# Patient Record
Sex: Male | Born: 1975
Health system: Southern US, Community
[De-identification: ages and names within clinical notes are randomized; demographics above are authoritative.]

## PROBLEM LIST (undated history)

## (undated) DIAGNOSIS — J45909 Unspecified asthma, uncomplicated: Secondary | ICD-10-CM

---

## 2015-07-28 ENCOUNTER — Emergency Department (INDEPENDENT_AMBULATORY_CARE_PROVIDER_SITE_OTHER)
Admission: EM | Admit: 2015-07-28 | Discharge: 2015-07-28 | Disposition: A | Discharge status: Home / Self Care | Payer: Medicaid Other | Source: Home / Self Care | Attending: Family Medicine | Admitting: Family Medicine

## 2015-07-28 ENCOUNTER — Encounter (HOSPITAL_COMMUNITY): Payer: Self-pay | Admitting: Emergency Medicine

## 2015-07-28 DIAGNOSIS — J4521 Mild intermittent asthma with (acute) exacerbation: Secondary | ICD-10-CM

## 2015-07-28 HISTORY — DX: Unspecified asthma, uncomplicated: J45.909

## 2015-07-28 MED ORDER — IPRATROPIUM-ALBUTEROL 0.5-2.5 (3) MG/3ML IN SOLN
3.0000 mL | Freq: Once | RESPIRATORY_TRACT | Status: AC
  Administered 2015-07-28: 3 mL via RESPIRATORY_TRACT

## 2015-07-28 MED ORDER — IPRATROPIUM-ALBUTEROL 0.5-2.5 (3) MG/3ML IN SOLN
RESPIRATORY_TRACT | Status: AC
  Filled 2015-07-28: qty 3

## 2015-07-28 MED ORDER — ALBUTEROL SULFATE (2.5 MG/3ML) 0.083% IN NEBU
2.5000 mg | INHALATION_SOLUTION | Freq: Once | RESPIRATORY_TRACT | Status: AC
  Administered 2015-07-28: 2.5 mg via RESPIRATORY_TRACT

## 2015-07-28 MED ORDER — PREDNISONE 10 MG PO TABS: 50.0000 mg | ORAL_TABLET | Freq: Every day | ORAL | Status: DC

## 2015-07-28 MED ORDER — ALBUTEROL SULFATE (2.5 MG/3ML) 0.083% IN NEBU
INHALATION_SOLUTION | RESPIRATORY_TRACT | Status: AC
  Filled 2015-07-28: qty 3

## 2015-07-28 MED ORDER — METHYLPREDNISOLONE SODIUM SUCC 125 MG IJ SOLR
INTRAMUSCULAR | Status: AC
  Filled 2015-07-28: qty 2

## 2015-07-28 MED ORDER — ALBUTEROL SULFATE 108 (90 BASE) MCG/ACT IN AEPB: 2.0000 | INHALATION_SPRAY | RESPIRATORY_TRACT | Status: DC | PRN

## 2015-07-28 MED ORDER — METHYLPREDNISOLONE SODIUM SUCC 125 MG IJ SOLR
125.0000 mg | Freq: Once | INTRAMUSCULAR | Status: AC
  Administered 2015-07-28: 125 mg via INTRAMUSCULAR

## 2015-07-28 NOTE — ED Provider Notes (Signed)
CSN: [id number]     Arrival date & time 07/28/15  1308 History   First MD Initiated Contact with Patient 07/28/15 1332     Chief Complaint  Patient presents with  . Asthma   (Consider location/radiation/quality/duration/timing/severity/associated sxs/prior Treatment) HPI Shortness of breath or wheezing. Started 8 days ago. Patient immigrated to [place] from [place] 9 days ago. Associated with nonproductive cough. Patient states that his chest feels tight. Denies any chest pain, fevers, productive cough, rhinorrhea, neck stiffness, LOC. States that he has had a history of asthma flares in the past and has received injections in his home country of [place] for this. Currently nothing makes his symptoms better. Symptoms are constant. Symptoms are getting worse.     Past Medical History  Diagnosis Date  . Asthma    History reviewed. No pertinent past surgical history. Family History  Problem Relation Age of Onset  . Family history unknown: Yes   Social History  Substance Use Topics  . Smoking status: Never Smoker   . Smokeless tobacco: None  . Alcohol Use: No    Review of Systems Per HPI with all other pertinent systems negative.   Allergies  Review of patient's allergies indicates no known allergies.  Home Medications   Prior to Admission medications   Medication Sig Start Date End Date Taking? Authorizing Provider  Albuterol Sulfate (PROAIR RESPICLICK) 108 (90 BASE) MCG/ACT AEPB Inhale 2 puffs into the lungs every 4 (four) hours as needed (wheezing). Every 4 hours as needed for wheezing 07/28/15   Ozella Rocks, MD  predniSONE (DELTASONE) 10 MG tablet Take 5 tablets (50 mg total) by mouth daily with breakfast. 07/28/15   Ozella Rocks, MD   There were no vitals taken for this visit. Physical Exam Physical Exam  Constitutional: oriented to person, place, and time. appears well-developed and well-nourished. No distress.  HENT:  Head: Normocephalic and atraumatic.   Eyes: EOMI. PERRL.  Neck: Normal range of motion.  Cardiovascular: RRR, no m/r/g, 2+ distal pulses,  Pulmonary/Chest: Diffuse wheezing in all lung fields, decreased air movement, increase respiratory effort. After nebulizer treatments patient significantly improved in his respiratory status with persistent wheezing but excellent aeration. Abdominal: Soft. Bowel sounds are normal. NonTTP, no distension.  Musculoskeletal: Normal range of motion. Non ttp, no effusion.  Neurological: alert and oriented to person, place, and time.  Skin: Skin is warm. No rash noted. non diaphoretic.  Psychiatric: normal mood and affect. behavior is normal. Judgment and thought content normal.   ED Course  Procedures (including critical care time) Labs Review Labs Reviewed - No data to display  Imaging Review No results found.   MDM   1. Acute asthma flare, mild intermittent    DuoNeb and separate albuterol 2.5 neb administered in clinic. Solu-Medrol 125 mg IM given. Patient given prescription for albuterol inhaler as well as continued prednisone dosing for 5 days. Very detailed return precautions discussed. Patient encounter aided by interpreter.    Ozella Rocks, MD 07/28/15 [phone]

## 2015-07-28 NOTE — ED Notes (Signed)
C/o asthma States he is sob, wheezing, and coughing Denies any meds Did received steroids for asthma years ago

## 2015-07-28 NOTE — Discharge Instructions (Signed)
You are suffering from an asthma flare Please use the inhaler every 4 hours for the next 24 hours, then as needed Please take the steroids every morning with breakfast

## 2015-10-14 ENCOUNTER — Encounter (HOSPITAL_COMMUNITY): Payer: Self-pay | Admitting: Emergency Medicine

## 2015-10-14 ENCOUNTER — Emergency Department (HOSPITAL_COMMUNITY)
Admission: EM | Admit: 2015-10-14 | Discharge: 2015-10-14 | Disposition: A | Discharge status: Home / Self Care | Payer: Medicaid Other | Source: Home / Self Care | Attending: Family Medicine | Admitting: Family Medicine

## 2015-10-14 DIAGNOSIS — J069 Acute upper respiratory infection, unspecified: Secondary | ICD-10-CM

## 2015-10-14 DIAGNOSIS — J040 Acute laryngitis: Secondary | ICD-10-CM | POA: Diagnosis not present

## 2015-10-14 DIAGNOSIS — R0982 Postnasal drip: Secondary | ICD-10-CM | POA: Diagnosis not present

## 2015-10-14 NOTE — ED Notes (Signed)
Cough, sneezing, runny nose, chest congestion, and headache for 2 days.  Unable to sleep at night due to cough

## 2015-10-14 NOTE — ED Provider Notes (Signed)
CSN: [id number]     Arrival date & time 10/14/15  1325 History   First MD Initiated Contact with Patient 10/14/15 1518     Chief Complaint  Patient presents with  . URI   (Consider location/radiation/quality/duration/timing/severity/associated sxs/prior Treatment) HPI Comments: 39 year old male from [place] who is able to speak English is complaining of chest congestion, headache, runny nose, PND and equivocal fever at home. He has a history of asthma and uses albuterol HFA. 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 He is asking for a note stating that "this work place is bad for me". She has no signs of distress.   Past Medical History  Diagnosis Date  . Asthma    History reviewed. No pertinent past surgical history. Family History  Problem Relation Age of Onset  . Family history unknown: Yes   Social History  Substance Use Topics  . Smoking status: Never Smoker   . Smokeless tobacco: None  . Alcohol Use: No    Review of Systems  Constitutional: Positive for fever and activity change. Negative for diaphoresis and fatigue.  HENT: Positive for postnasal drip, rhinorrhea, sore throat and voice change. Negative for ear pain, facial swelling and trouble swallowing.   Eyes: Negative for pain, discharge and redness.  Respiratory: Positive for cough and wheezing. Negative for chest tightness and shortness of breath.   Cardiovascular: Negative.   Gastrointestinal: Negative.   Musculoskeletal: Negative.  Negative for neck pain and neck stiffness.  Neurological: Negative.   All other systems reviewed and are negative.   Allergies  Review of patient's allergies indicates no known allergies.  Home Medications   Prior to Admission medications   Medication Sig Start Date End Date Taking? Authorizing Provider  Albuterol Sulfate (PROAIR RESPICLICK) 108 (90 BASE) MCG/ACT AEPB Inhale 2 puffs into the lungs  every 4 (four) hours as needed (wheezing). Every 4 hours as needed for wheezing 07/28/15   Ozella Rocksavid J Merrell, MD  predniSONE (DELTASONE) 10 MG tablet Take 5 tablets (50 mg total) by mouth daily with breakfast. Patient not taking: Reported on 10/14/2015 07/28/15   Ozella Rocksavid J Merrell, MD   Meds Ordered and Administered this Visit  Medications - No data to display  BP 106/67 mmHg  Pulse 70  Temp(Src) 98.7 F (37.1 C) (Oral)  Resp 16  SpO2 100% No data found.   Physical Exam  Constitutional: He is oriented to person, place, and time. He appears well-developed and well-nourished. No distress.  HENT:  Bilateral TMs are normal Oropharynx with minor erythema and scant clear PND. No exudates.  Eyes: Conjunctivae and EOM are normal.  Neck: Normal range of motion. Neck supple.  Cardiovascular: Normal rate, regular rhythm and normal heart sounds.   Pulmonary/Chest: Effort normal. No respiratory distress. He has no rales.  Few faint scattered wheezes with forced expiration. Forced cough increases the sound of wheeze and minor coarseness. Expiratory phase is normal. Good air movement. No other adventitious sounds.  Abdominal: Soft. There is no tenderness.  Musculoskeletal: Normal range of motion. He exhibits no edema.  Lymphadenopathy:    He has no cervical adenopathy.  Neurological: He is alert and oriented to person, place, and time.  Skin: Skin is warm and dry. No rash noted.  Psychiatric: He has a normal mood and affect.  Nursing note and vitals reviewed.   ED Course  Procedures (including critical care time)  Labs Review Labs Reviewed -  No data to display  Imaging Review No results found.   Visual Acuity Review  Right Eye Distance:   Left Eye Distance:   Bilateral Distance:    Right Eye Near:   Left Eye Near:    Bilateral Near:         MDM   1. URI (upper respiratory infection)   2. PND (post-nasal drip)   3. Laryngitis    Upper Respiratory Infection, Adult Take  Allegra or Zyrtec daily for drainage and runny nose. Atrovent nasal spray as needed for runny nose For congestion may take Sudafed PE 10 mg every 4 hours as needed For cough Robitussin DM. Also use your albuterol HFA for wheezing and cough. Drink plenty of fluids stay well-hydrated.    Hayden Rasmussen, NP 10/14/15 [phone]

## 2015-10-14 NOTE — Discharge Instructions (Signed)
Upper Respiratory Infection, Adult Take Allegra or Zyrtec daily for drainage and runny nose. Atrovent nasal spray as needed for runny nose For congestion may take Sudafed PE 10 mg every 4 hours as needed For cough Robitussin DM. Also use your albuterol HFA for wheezing and cough. Drink plenty of fluids stay well-hydrated. Most upper respiratory infections (URIs) are caused by a virus. A URI affects the nose, throat, and upper air passages. The most common type of URI is often called "the common cold." HOME CARE   Take medicines only as told by your doctor.  Gargle warm saltwater or take cough drops to comfort your throat as told by your doctor.  Use a warm mist humidifier or inhale steam from a shower to increase air moisture. This may make it easier to breathe.  Drink enough fluid to keep your pee (urine) clear or pale yellow.  Eat soups and other clear broths.  Have a healthy diet.  Rest as needed.  Go back to work when your fever is gone or your doctor says it is okay.  You may need to stay home longer to avoid giving your URI to others.  You can also wear a face mask and wash your hands often to prevent spread of the virus.  Use your inhaler more if you have asthma.  Do not use any tobacco products, including cigarettes, chewing tobacco, or electronic cigarettes. If you need help quitting, ask your doctor. GET HELP IF:  You are getting worse, not better.  Your symptoms are not helped by medicine.  You have chills.  You are getting more short of breath.  You have brown or red mucus.  You have yellow or brown discharge from your nose.  You have pain in your face, especially when you bend forward.  You have a fever.  You have puffy (swollen) neck glands.  You have pain while swallowing.  You have white areas in the back of your throat. GET HELP RIGHT AWAY IF:   You have very bad or constant:  Headache.  Ear pain.  Pain in your forehead, behind your  eyes, and over your cheekbones (sinus pain).  Chest pain.  You have long-lasting (chronic) lung disease and any of the following:  Wheezing.  Long-lasting cough.  Coughing up blood.  A change in your usual mucus.  You have a stiff neck.  You have changes in your:  Vision.  Hearing.  Thinkin Laryngitis Laryngitis is swelling (inflammation) of your vocal cords. This causes hoarseness, coughing, loss of voice, sore throat, or a dry throat. When your vocal cords are inflamed, your voice sounds different. Laryngitis can be temporary (acute) or long-term (chronic). Most cases of acute laryngitis improve with time. Chronic laryngitis is laryngitis that lasts for more than three weeks. HOME CARE Drink enough fluid to keep your pee (urine) clear or pale yellow. Breathe in moist air. Use a humidifier if you live in a dry climate. Take medicines only as told by your doctor. Do not smoke cigarettes or electronic cigarettes. If you need help quitting, ask your doctor. Talk as little as possible. Also avoid whispering, which can cause vocal strain. Write instead of talking. Do this until your voice is back to normal. GET HELP IF: You have a fever. Your pain is worse. You have trouble swallowing. GET HELP RIGHT AWAY IF: You cough up blood. You have trouble breathing.   This information is not intended to replace advice given to you by your health care  provider. Make sure you discuss any questions you have with your health care provider.   Document Released: 11/09/2011 Document Revised: 12/11/2014 Document Reviewed: 05/05/2014 Elsevier Interactive Patient Education 2016 [employer].  g.  Mood. MAKE SURE YOU:   Understand these instructions.  Will watch your condition.  Will get help right away if you are not doing well or get worse.   This information is not intended to replace advice given to you by your health care provider. Make sure you discuss any questions you have  with your health care provider.   Document Released: 05/08/2008 Document Revised: 04/06/2015 Document Reviewed: 02/25/2014 Elsevier Interactive Patient Education [employer]

## 2015-11-12 DIAGNOSIS — Z825 Family history of asthma and other chronic lower respiratory diseases: Secondary | ICD-10-CM | POA: Insufficient documentation

## 2016-01-13 ENCOUNTER — Ambulatory Visit: Payer: Medicaid Other | Admitting: Family Medicine

## 2016-01-17 ENCOUNTER — Encounter: Payer: Self-pay | Admitting: Family Medicine

## 2016-01-17 ENCOUNTER — Ambulatory Visit: Payer: Medicaid Other | Attending: Family Medicine | Admitting: Family Medicine

## 2016-01-17 VITALS — BP 105/68 | HR 65 | Temp 98.4°F | Resp 16 | Ht 69.0 in | Wt 173.0 lb

## 2016-01-17 DIAGNOSIS — J4520 Mild intermittent asthma, uncomplicated: Secondary | ICD-10-CM

## 2016-01-17 DIAGNOSIS — R109 Unspecified abdominal pain: Secondary | ICD-10-CM | POA: Diagnosis not present

## 2016-01-17 DIAGNOSIS — J45909 Unspecified asthma, uncomplicated: Secondary | ICD-10-CM | POA: Insufficient documentation

## 2016-01-17 DIAGNOSIS — R1032 Left lower quadrant pain: Secondary | ICD-10-CM

## 2016-01-17 DIAGNOSIS — M25541 Pain in joints of right hand: Secondary | ICD-10-CM | POA: Diagnosis not present

## 2016-01-17 DIAGNOSIS — Z114 Encounter for screening for human immunodeficiency virus [HIV]: Secondary | ICD-10-CM

## 2016-01-17 DIAGNOSIS — Z0000 Encounter for general adult medical examination without abnormal findings: Secondary | ICD-10-CM | POA: Diagnosis not present

## 2016-01-17 DIAGNOSIS — M25562 Pain in left knee: Secondary | ICD-10-CM | POA: Insufficient documentation

## 2016-01-17 DIAGNOSIS — J4540 Moderate persistent asthma, uncomplicated: Secondary | ICD-10-CM | POA: Insufficient documentation

## 2016-01-17 LAB — COMPLETE METABOLIC PANEL WITH GFR
ALK PHOS: 51 U/L (ref 40–115)
ALT: 23 U/L (ref 9–46)
AST: 21 U/L (ref 10–40)
Albumin: 4.1 g/dL (ref 3.6–5.1)
BILIRUBIN TOTAL: 0.3 mg/dL (ref 0.2–1.2)
BUN: 10 mg/dL (ref 7–25)
CALCIUM: 9.1 mg/dL (ref 8.6–10.3)
CO2: 27 mmol/L (ref 20–31)
CREATININE: 0.91 mg/dL (ref 0.60–1.35)
Chloride: 105 mmol/L (ref 98–110)
GFR, Est African American: >89 mL/min (ref >=60)
Glucose, Bld: 90 mg/dL (ref 65–99)
Potassium: 3.9 mmol/L (ref 3.5–5.3)
SODIUM: 140 mmol/L (ref 135–146)
Total Protein: 6.7 g/dL (ref 6.1–8.1)

## 2016-01-17 LAB — CBC
HCT: 39.5 % (ref 39.0–52.0)
Hemoglobin: 13.1 g/dL (ref 13.0–17.0)
MCH: 29 pg (ref 26.0–34.0)
MCHC: 33.2 g/dL (ref 30.0–36.0)
MCV: 87.6 fL (ref 78.0–100.0)
MPV: 10.3 fL (ref 8.6–12.4)
Platelets: 253 10*3/uL (ref 150–400)
RBC: 4.51 MIL/uL (ref 4.22–5.81)
RDW: 13.1 % (ref 11.5–15.5)
WBC: 4.5 10*3/uL (ref 4.0–10.5)

## 2016-01-17 LAB — HIV ANTIBODY (ROUTINE TESTING W REFLEX): HIV: NONREACTIVE

## 2016-01-17 LAB — POCT GLYCOSYLATED HEMOGLOBIN (HGB A1C): HEMOGLOBIN A1C: 5.6

## 2016-01-17 MED ORDER — NAPROXEN 500 MG PO TABS: 500.0000 mg | ORAL_TABLET | Freq: Two times a day (BID) | ORAL | Status: DC | PRN

## 2016-01-17 MED ORDER — ALBUTEROL SULFATE HFA 108 (90 BASE) MCG/ACT IN AERS: 2.0000 | INHALATION_SPRAY | Freq: Four times a day (QID) | RESPIRATORY_TRACT | Status: DC | PRN

## 2016-01-17 MED ORDER — ALBUTEROL SULFATE 108 (90 BASE) MCG/ACT IN AEPB: 2.0000 | INHALATION_SPRAY | RESPIRATORY_TRACT | Status: DC | PRN

## 2016-01-17 MED FILL — NAPROXEN 500 MG TABLET: 500 | 15 days supply | Qty: 30 | Fill #0

## 2016-01-17 MED FILL — PROAIR HFA 90 MCG INHALER: 108 (90 BAS | 30 days supply | Qty: 9 | Fill #0

## 2016-01-17 NOTE — Progress Notes (Signed)
Used Video interpreter Arabic [phone] C/C abdominal pain x 1 year. Pain worsen with food  Lt knee pain due to injury 7 month ago. No pain today  No tobacco user  No suicidal thought in the past two weeks

## 2016-01-17 NOTE — Assessment & Plan Note (Signed)
Mild arthritis Recommend NSAID, ice, rest, compression as needed

## 2016-01-17 NOTE — Assessment & Plan Note (Signed)
Lower quadrant abdominal pain suspect lactose intolerant  Lactose free diet recommended

## 2016-01-17 NOTE — Assessment & Plan Note (Signed)
L medial knee pain, reported meniscal tear,  MRI images and report are not available for review. No joint laxity or locking or swelling on exam.   Knee x-ray Ortho referral

## 2016-01-17 NOTE — Assessment & Plan Note (Signed)
A: mild intermittent asthma P: Albuterol inhaler for prn use

## 2016-01-17 NOTE — Progress Notes (Signed)
LOGO@  Subjective:  Patient ID: Ernest Griffin, male    DOB: 06/03/1976  Age: 40 y.o. MRN: [id number]  CC: Knee Pain and Abdominal Pain   HPI Ernest Griffin presents to establish care, he is from [place]    1. L lower abdominal: sounds and gurgling for one year. There is no pain. No blood in stool. No family history colon cancer. No weight loss. Comes and goes. No blood in stool. No pain with bowel movements.    2. L knee pain: x 7 months. He had a fall down stairs. He was in [place] at the time. He was advised to have surgery for injury to his knee ligaments. He also had a tear in his meniscus. He now has popping in knee and pain. No recurrent falls. No knee swelling or redness. He denies instability. He denies locking.   3. R 4th finger pain: x 2 weeks. With slight swelling. No injury. He works at [employer], he has pain at work. When he is home he does not have pain.   4. Asthma: has rare symptoms. Not using albuterol. No cough, wheezing. CP or SOB. Non smoker.   Past Medical History  Diagnosis Date  . Asthma     History reviewed. No pertinent past surgical history.  Family History  Problem Relation Age of Onset  . Family history unknown: Yes    Social History  Substance Use Topics  . Smoking status: Never Smoker   . Smokeless tobacco: Not on file  . Alcohol Use: No    ROS Review of Systems  Constitutional: Negative for fever, chills, fatigue and unexpected weight change.  Eyes: Negative for visual disturbance.  Respiratory: Negative for cough and shortness of breath.   Cardiovascular: Negative for chest pain, palpitations and leg swelling.  Gastrointestinal: Positive for abdominal pain. Negative for nausea, vomiting, diarrhea, constipation and blood in stool.  Endocrine: Negative for polydipsia, polyphagia and polyuria.  Musculoskeletal: Positive for arthralgias. Negative for myalgias, back pain, gait problem and neck pain.  Skin: Negative for rash.    Allergic/Immunologic: Negative for immunocompromised state.  Hematological: Negative for adenopathy. Does not bruise/bleed easily.  Psychiatric/Behavioral: Negative for suicidal ideas, sleep disturbance and dysphoric mood. The patient is not nervous/anxious.     Objective:   Today's Vitals: BP 105/68 mmHg  Pulse 65  Temp(Src) 98.4 F (36.9 C) (Oral)  Resp 16  Wt 173 lb (78.472 kg)  SpO2 98%  Physical Exam  Constitutional: He appears well-developed and well-nourished. No distress.  HENT:  Head: Normocephalic and atraumatic.  Neck: Normal range of motion. Neck supple.  Cardiovascular: Normal rate, regular rhythm, normal heart sounds and intact distal pulses.   Pulmonary/Chest: Effort normal and breath sounds normal.  Abdominal: Soft. Bowel sounds are normal. He exhibits no distension and no mass. There is no tenderness. There is no rebound and no guarding.  Musculoskeletal: He exhibits no edema.       Left knee: He exhibits normal range of motion, no swelling, no effusion, no ecchymosis, no deformity, no laceration, no erythema, normal alignment, no LCL laxity, normal patellar mobility, no bony tenderness, normal meniscus and no MCL laxity. Tenderness found. Medial joint line tenderness noted. No lateral joint line, no MCL, no LCL and no patellar tendon tenderness noted.       Arms: Neurological: He is alert.  Skin: Skin is warm and dry. No rash noted. No erythema.  Psychiatric: He has a normal mood and affect.   Lab Results  Component Value Date   HGBA1C 5.60 01/17/2016    Assessment & Plan:   Problem List Items Addressed This Visit    Left knee pain   Relevant Orders   DG Knee 4 Views W/Patella Left   Ambulatory referral to Orthopedic Surgery   Joint pain in fingers of right hand   Relevant Medications   naproxen (NAPROSYN) 500 MG tablet   Asthma   Relevant Medications   albuterol (PROAIR HFA) 108 (90 Base) MCG/ACT inhaler   Abdominal pain   Relevant Orders    COMPLETE METABOLIC PANEL WITH GFR   CBC    Other Visit Diagnoses    Healthcare maintenance    -  Primary    Relevant Orders    HgB A1c (Completed)    Flu Vaccine QUAD 36+ mos IM (Completed)    Screening for HIV (human immunodeficiency virus)        Relevant Orders    HIV antibody (with reflex)       Outpatient Encounter Prescriptions as of 01/17/2016  Medication Sig  . Albuterol Sulfate (PROAIR RESPICLICK) 108 (90 BASE) MCG/ACT AEPB Inhale 2 puffs into the lungs every 4 (four) hours as needed (wheezing). Every 4 hours as needed for wheezing (Patient not taking: Reported on 01/17/2016)  . [DISCONTINUED] predniSONE (DELTASONE) 10 MG tablet Take 5 tablets (50 mg total) by mouth daily with breakfast. (Patient not taking: Reported on 10/14/2015)   No facility-administered encounter medications on file as of 01/17/2016.    Follow-up: No Follow-up on file.    Dessa Phi MD

## 2016-01-17 NOTE — Patient Instructions (Addendum)
Ernest Griffin was seen today for knee pain and abdominal pain.  Diagnoses and all orders for this visit:  Healthcare maintenance -     HgB A1c  Left lower quadrant pain -     Cancel: H. pylori breath test -     COMPLETE METABOLIC PANEL WITH GFR -     CBC  Left knee pain -     DG Knee 4 Views W/Patella Left; Future -     Ambulatory referral to Orthopedic Surgery  Screening for HIV (human immunodeficiency virus) -     HIV antibody (with reflex)  Asthma, mild intermittent, uncomplicated -     Albuterol Sulfate (PROAIR RESPICLICK) 108 (90 Base) MCG/ACT AEPB; Inhale 2 puffs into the lungs every 4 (four) hours as needed (wheezing and coughing). Every 4 hours as needed for wheezing  Joint pain in fingers of right hand -     naproxen (NAPROSYN) 500 MG tablet; Take 1 tablet (500 mg total) by mouth 2 (two) times daily as needed (with food).   Tape finger Use topical pain relief like capsaicin or icy hot Rest finger when not at work.    F/u in 3 months  Dr. Armen Pickup   Disaccharides Lactose Milk, custard, ice cream, and yogurt   Diet for Lactose Intolerance, Adult Lactose intolerance is when the body is not able to digest lactose, a natural sugar found in milk and milk products. If you are lactose intolerant, you should avoid consuming food and drinks with lactose.  WHAT DO I NEED TO KNOW ABOUT THIS DIET?  Avoid consuming foods and beverages with lactose.  Look for the words "lactose-free" or "lactose-reduced" on food labels. You can have lactose-free foods and may be able to have small amounts of lactose-reduced foods.  Make sure you get enough nutrients in your diet. People on this diet sometimes have trouble getting enough calcium, riboflavin, and vitamin D. Take supplements if directed by your health care provider. Talk to your health care provider about supplements if you are not taking any. WHICH FOODS HAVE LACTOSE? Lactose is found in milk and milk products, such as:   Yogurt.    Cheese.  Butter.   Margarine.  Sour cream.   Creamer.   Whipped toppings and nondairy creamers.  Ice cream and other milk-based desserts. Lactose is also found in foods made with milk or milk ingredients. To find out whether a food is made with milk or a milk ingredient, look at the ingredients list. Avoid foods with the statement "May contain milk" and foods that contain:   Butter.   Cream.  Milk.  Milk solids.  Milk powder.   Whey.  Curd.  Caseinate.  Lactose. WHAT ARE SOME ALTERNATIVES TO MILK AND FOODS MADE WITH MILK PRODUCTS?  Lactose-free products, such as lactose-free milk.  Almond or rice milk.  Soy products, such as soy yogurt, soy cheese, soy ice cream, soy-based sour cream, and soy-based infant formula.  Nondairy products, such as nondairy creamers and nondairy whipped topping. Note that nondairy products sometimes contain lactose, so it is important to check the ingredients list. CAN I HAVE ANY FOODS WITH LACTOSE? Some people with lactose intolerance can safely eat foods that have a little lactose. Foods with a little lactose have less than 1 g of lactose per serving. Examples of foods with a little lactose are:   Aged cheese (such as Swiss, cheddar, or Parmesan cheese). One serving is about 1-2 oz.  Cream cheese. One serving is about 2 Tbsp.  Ricotta cheese. One serving is about  cup. If you decide to try a food that has lactose:   Eat only one food with lactose in it at a time.  Eat only a small amount of the food.  Stop eating the food if your symptoms return. Some dairy products that are more likely than others to be tolerated include:  Cheese, especially if it is aged.  Cultured dairy products, such as yogurt, buttermilk, cottage cheese, and kefir. The healthy bacteria in these products help digest lactose.  Lactose-hydrolyzed milk. This product contains 40-90% less lactose than milk. AM I GETTING ENOUGH CALCIUM? Calcium  is found in many foods with lactose and is important for bone health. The amount of calcium you need depends on your age:   Adults younger than 50 years need 1000 mg of calcium a day.  Adults older than 50 years need 1200 mg of calcium a day. Make sure you get enough calcium by taking a calcium supplement or by eating lactose-free foods that are high in calcium, such as:   Almonds,  cup (95 mg).  Broccoli, cooked, 1 cup (60 mg).  Calcium-fortified breakfast cereals, 1 cup ([id number] mg).  Calcium-fortified rice or almond milk, 1 cup (300 mg).  Calcium-fortified soy milk, 1 cup (300-400 mg).  Canned salmon with edible bones, 3 oz (180 mg).  Collard greens, cooked,  cup (125 mg).  Edamame, cooked,  cup (125 mg).  Kale, frozen or cooked,  cup (90 mg).  Orange juice with calcium added, 1 cup (300-350 mg).  Sardines with edible bones, 3 oz (325 mg).  Spinach, cooked,  cup (145 mg).  Tofu set with calcium sulfate,  cup (250 mg).   This information is not intended to replace advice given to you by your health care provider. Make sure you discuss any questions you have with your health care provider.   Document Released: 06/17/2014 Document Reviewed: 06/17/2014 Elsevier Interactive Patient Education [employer]

## 2016-01-19 ENCOUNTER — Telehealth: Payer: Self-pay | Admitting: *Deleted

## 2016-01-19 NOTE — Telephone Encounter (Signed)
-----   Message from Josalyn Funches, MD sent at 01/18/2016  7:55 AM EST ----- All labs normal 

## 2016-01-19 NOTE — Telephone Encounter (Signed)
LVM to return call.

## 2016-01-24 ENCOUNTER — Other Ambulatory Visit: Payer: Self-pay | Admitting: Family Medicine

## 2016-01-24 DIAGNOSIS — J4520 Mild intermittent asthma, uncomplicated: Secondary | ICD-10-CM

## 2016-01-24 MED ORDER — ALBUTEROL SULFATE HFA 108 (90 BASE) MCG/ACT IN AERS: 2.0000 | INHALATION_SPRAY | Freq: Four times a day (QID) | RESPIRATORY_TRACT | Status: DC | PRN

## 2016-02-01 ENCOUNTER — Ambulatory Visit: Payer: Medicaid Other | Admitting: Family Medicine

## 2016-02-21 ENCOUNTER — Ambulatory Visit: Payer: Medicaid Other | Admitting: Sports Medicine

## 2016-03-06 ENCOUNTER — Ambulatory Visit (INDEPENDENT_AMBULATORY_CARE_PROVIDER_SITE_OTHER): Payer: Medicaid Other | Admitting: Sports Medicine

## 2016-03-06 ENCOUNTER — Encounter: Payer: Self-pay | Admitting: Sports Medicine

## 2016-03-06 VITALS — BP 116/85 | HR 71 | Ht 69.0 in | Wt 177.0 lb

## 2016-03-06 DIAGNOSIS — M25562 Pain in left knee: Secondary | ICD-10-CM | POA: Diagnosis present

## 2016-03-06 MED ORDER — IBUPROFEN 600 MG PO TABS: 600.0000 mg | ORAL_TABLET | Freq: Three times a day (TID) | ORAL | Status: DC | PRN

## 2016-03-06 MED FILL — IBUPROFEN 600 MG TABLET: 600 | 20 days supply | Qty: 60 | Fill #0

## 2016-03-06 NOTE — Progress Notes (Signed)
Patient ID: Ernest Griffin, male   DOB: 08/17/1976, 40 y.o.   MRN: [id number]   Interpreter for visit is Virgel BouquetJameel Ali

## 2016-03-06 NOTE — Progress Notes (Signed)
   Subjective:    Patient ID: Ernest Griffin, male    DOB: 02/12/1976, 40 y.o.   MRN: [id number]  HPI chief complaint: Left knee pain  Very pleasant 40 year old [place] male comes in today complaining of left knee pain. About a year ago, he injured his left knee in a fall while living in [place]. He had significant pain and swelling at the time and was evaluated. X-rays and an MRI were done and surgery was recommended but he was unable to pursue surgery because he was moving to the [place]. Since his injury he has had mild pain and mild swelling but his main concern is a "noise" in his knee which is present primarily with prolonged standing which he has to do as part of his job. No instability. No locking or catching. No prior knee surgeries. No hip pain. No numbness or tingling. He has not had any recent x-rays or imaging.  He is also complaining of intermittent pain and swelling in the PIP joint of his right fourth finger. This happens primarily with gripping cold objects at work. No trauma to this joint in the past. No radiating pain. No triggering. Entire history is obtained with the help of an interpreter.  Medical history reviewed. He has a history of asthma No known drug allergies    Review of Systems    as above Objective:   Physical Exam  Well-developed, well-nourished. No acute distress. Awake alert and oriented 3. Vital signs reviewed  Left knee: Full range of motion. No effusion. No soft tissue swelling. Negative patellar apprehension. No patellofemoral crepitus. Knee is stable to valgus and varus stressing. Negative Lachman's, negative anterior drawer. Negative posterior drawer. No joint line tenderness. Negative dial test. Negative McMurray's. Negative Thessaly's. Normal alignment. Neurovascularly intact distally.  Right hand: Examination of the right fourth finger shows some mild swelling at the PIP joint. Good stability. Flexor and extensor tendons are intact. No  triggering. Neurovascularly intact distally.      Assessment & Plan:   Mild left knee pain status post fall one year ago Right fourth finger PIP pain and swelling likely secondary to synovitis versus mild DJD  I will get plain x-rays of the left knee. I would like to review the MRI done last year but unfortunately this was done in [place] and I don't think I will be able to get those films. He will take 600 mg of Motrin 3 times daily as needed. We will try him in either a knee sleeve or knee brace and he will wear this at work. He will also buddy tape his fourth and fifth fingers while at work. Follow-up with me in 3 weeks for reevaluation. We will determine the need for further diagnostic imaging based on his x-ray findings and his response to today's treatment.

## 2016-03-20 ENCOUNTER — Ambulatory Visit: Payer: Medicaid Other | Admitting: Family Medicine

## 2016-03-27 ENCOUNTER — Ambulatory Visit: Payer: Medicaid Other | Admitting: Sports Medicine

## 2016-04-07 ENCOUNTER — Ambulatory Visit (HOSPITAL_COMMUNITY)
Admission: EM | Admit: 2016-04-07 | Discharge: 2016-04-07 | Disposition: A | Discharge status: Home / Self Care | Payer: Medicaid Other | Attending: Emergency Medicine | Admitting: Emergency Medicine

## 2016-04-07 ENCOUNTER — Encounter (HOSPITAL_COMMUNITY): Payer: Self-pay | Admitting: Emergency Medicine

## 2016-04-07 DIAGNOSIS — M778 Other enthesopathies, not elsewhere classified: Secondary | ICD-10-CM

## 2016-04-07 DIAGNOSIS — M7711 Lateral epicondylitis, right elbow: Secondary | ICD-10-CM

## 2016-04-07 DIAGNOSIS — M779 Enthesopathy, unspecified: Secondary | ICD-10-CM

## 2016-04-07 MED ORDER — DICLOFENAC POTASSIUM 50 MG PO TABS: 50.0000 mg | ORAL_TABLET | Freq: Three times a day (TID) | ORAL | Status: DC

## 2016-04-07 NOTE — Discharge Instructions (Signed)
Tendinitis Wear wrist splint, ice and cataflam for pain as needed.  Tendinitis is swelling and inflammation of the tendons. Tendons are band-like tissues that connect muscle to bone. Tendinitis commonly occurs in the:   Shoulders (rotator cuff).  Heels (Achilles tendon).  Elbows (triceps tendon). CAUSES Tendinitis is usually caused by overusing the tendon, muscles, and joints involved. When the tissue surrounding a tendon (synovium) becomes inflamed, it is called tenosynovitis. Tendinitis commonly develops in people whose jobs require repetitive motions. SYMPTOMS  Pain.  Tenderness.  Mild swelling. DIAGNOSIS Tendinitis is usually diagnosed by physical exam. Your health care provider may also order X-rays or other imaging tests. TREATMENT Your health care provider may recommend certain medicines or exercises for your treatment. HOME CARE INSTRUCTIONS   Use a sling or splint for as long as directed by your health care provider until the pain decreases.  Put ice on the injured area.  Put ice in a plastic bag.  Place a towel between your skin and the bag.  Leave the ice on for 15-20 minutes, 3-4 times a day, or as directed by your health care provider.  Avoid using the limb while the tendon is painful. Perform gentle range of motion exercises only as directed by your health care provider. Stop exercises if pain or discomfort increase, unless directed otherwise by your health care provider.  Only take over-the-counter or prescription medicines for pain, discomfort, or fever as directed by your health care provider. SEEK MEDICAL CARE IF:   Your pain and swelling increase.  You develop new, unexplained symptoms, especially increased numbness in the hands. MAKE SURE YOU:   Understand these instructions.  Will watch your condition.  Will get help right away if you are not doing well or get worse.   This information is not intended to replace advice given to you by your  health care provider. Make sure you discuss any questions you have with your health care provider.   Document Released: 11/17/2000 Document Revised: 12/11/2014 Document Reviewed: 02/06/2011 Elsevier Interactive Patient Education 2016 [employer].  Tennis Elbow Tennis elbow is puffiness (inflammation) of the outer tendons of your forearm close to your elbow. Your tendons attach your muscles to your bones. Tennis elbow can happen in any sport or job in which you use your elbow too much. It is caused by doing the same motion over and over. Tennis elbow can cause:  Pain and tenderness in your forearm and the outer part of your elbow.  A burning feeling. This runs from your elbow through your arm.  Weak grip in your hands. HOME CARE Activity  Rest your elbow and wrist as told by your doctor. Try to avoid any activities that caused the problem until your doctor says that you can do them again.  If a physical therapist teaches you exercises, do all of them as told.  If you lift an object, lift it with your palm facing up. This is easier on your elbow. Lifestyle  If your tennis elbow is caused by sports, check your equipment and make sure that:  You are using it correctly.  It fits you well.  If your tennis elbow is caused by work, take breaks often, if you are able. Talk with your manager about doing your work in a way that is safe for you.  If your tennis elbow is caused by computer use, talk with your manager about any changes that can be made to your work setup. General Instructions  If told, apply  ice to the painful area:  Put ice in a plastic bag.  Place a towel between your skin and the bag.  Leave the ice on for 20 minutes, 2-3 times per day.  Take medicines only as told by your doctor.  If you were given a brace, wear it as told by your doctor.  Keep all follow-up visits as told by your doctor. This is important. GET HELP IF:  Your pain does not get better with  treatment.  Your pain gets worse.  You have weakness in your forearm, hand, or fingers.  You cannot feel your forearm, hand, or fingers.   This information is not intended to replace advice given to you by your health care provider. Make sure you discuss any questions you have with your health care provider.   Document Released: 05/10/2010 Document Revised: 04/06/2015 Document Reviewed: 11/16/2014 Elsevier Interactive Patient Education [employer] Elsevier Inc.
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
## 2016-04-07 NOTE — ED Provider Notes (Signed)
CSN: [id number]     Arrival date & time 04/07/16  1534 History   First MD Initiated Contact with Patient 04/07/16 1620     Chief Complaint  Patient presents with  . Hand Pain   (Consider location/radiation/quality/duration/timing/severity/associated sxs/prior Treatment) HPI Comments: 40 year old male from [place] that speaks Arabic presents to the urgent care with complaints of right wrist pain with radiation to the elbow and lesser to the right shoulder. This pain occurs only with work and use of the arm. The discomfort is located to the dorsum of the wrist, the brachioradialis muscle and over the lateral apical condyle. It started approximately 3-4 days ago.  The history is provided by the patient. The history is limited by a language barrier. A language interpreter was used.    Past Medical History  Diagnosis Date  . Asthma    History reviewed. No pertinent past surgical history. Family History  Problem Relation Age of Onset  . Family history unknown: Yes   Social History  Substance Use Topics  . Smoking status: Never Smoker   . Smokeless tobacco: Never Used  . Alcohol Use: No    Review of Systems  Constitutional: Positive for activity change. Negative for fever and fatigue.  HENT: Negative.   Respiratory: Negative.   Gastrointestinal: Negative.   Musculoskeletal: Positive for arthralgias. Negative for joint swelling.  Skin: Negative.   Neurological: Negative.   Psychiatric/Behavioral: Negative.     Allergies  Review of patient's allergies indicates no known allergies.  Home Medications   Prior to Admission medications   Medication Sig Start Date End Date Taking? Authorizing Provider  albuterol (PROVENTIL HFA;VENTOLIN HFA) 108 (90 Base) MCG/ACT inhaler Inhale 2 puffs into the lungs every 6 (six) hours as needed for wheezing or shortness of breath. 01/24/16   Josalyn Funches, MD  ibuprofen (ADVIL,MOTRIN) 600 MG tablet Take 1 tablet (600 mg total) by mouth every 8  (eight) hours as needed. 03/06/16   Ralene Cork, DO   Meds Ordered and Administered this Visit  Medications - No data to display  BP 102/62 mmHg  Pulse 72  Temp(Src) 98.3 F (36.8 C) (Oral)  Resp 18  SpO2 98% No data found.   Physical Exam  Constitutional: He appears well-developed and well-nourished. No distress.  Eyes: EOM are normal.  Neck: Normal range of motion. Neck supple.  Cardiovascular: Normal rate.   Pulmonary/Chest: Effort normal.  Musculoskeletal: Normal range of motion. He exhibits tenderness. He exhibits no edema.  Right upper extremity examination reveals no swelling, deformity, skin lesions or discoloration. There is tenderness to the mid portion of the wrist primarily to the extensor surface. There is also tenderness along the extensor surface of the right ring finger and metacarpal. The patient points to the brachioradialis and the outer elbow. There is tenderness over the right lateral epicondyles. Demonstrates full range of motion of the elbow, wrist, hand and digits. No tenderness to the upper arm or shoulder. Distal neurovascular and motor sensory is intact. Normal warmth and color.  Neurological: He is alert. He exhibits normal muscle tone.  Skin: Skin is warm and dry.  Psychiatric: He has a normal mood and affect. His behavior is normal.  Nursing note and vitals reviewed.   ED Course  Procedures (including critical care time)  Labs Review Labs Reviewed - No data to display  Imaging Review No results found.   Visual Acuity Review  Right Eye Distance:   Left Eye Distance:   Bilateral Distance:  Right Eye Near:   Left Eye Near:    Bilateral Near:         MDM   1. Right wrist tendinitis   2. Lateral epicondylitis (tennis elbow), right   3. Tendinitis of finger    Meds ordered this encounter  Medications  . diclofenac (CATAFLAM) 50 MG tablet    Sig: Take 1 tablet (50 mg total) by mouth 3 (three) times daily. One tablet TID with  food prn pain.    Dispense:  21 tablet    Refill:  0    Order Specific Question:  Supervising Provider    Answer:  Charm RingsHONIG, ERIN J [[id number]]   Wear wrist splint, ice and cataflam for pain as needed. Work note, limit use right arm. F/u wit PCP     Hayden Rasmussenavid Meral Geissinger, NP 04/07/16 1650

## 2016-04-21 ENCOUNTER — Encounter (HOSPITAL_COMMUNITY): Payer: Self-pay

## 2016-04-21 ENCOUNTER — Ambulatory Visit (HOSPITAL_COMMUNITY)
Admission: EM | Admit: 2016-04-21 | Discharge: 2016-04-21 | Disposition: A | Discharge status: Home / Self Care | Payer: Medicaid Other | Attending: Family Medicine | Admitting: Family Medicine

## 2016-04-21 ENCOUNTER — Ambulatory Visit (INDEPENDENT_AMBULATORY_CARE_PROVIDER_SITE_OTHER): Payer: Medicaid Other

## 2016-04-21 DIAGNOSIS — G8929 Other chronic pain: Secondary | ICD-10-CM

## 2016-04-21 DIAGNOSIS — M654 Radial styloid tenosynovitis [de Quervain]: Secondary | ICD-10-CM | POA: Diagnosis not present

## 2016-04-21 DIAGNOSIS — M25531 Pain in right wrist: Secondary | ICD-10-CM

## 2016-04-21 MED ORDER — NAPROXEN 500 MG PO TABS: 500.0000 mg | ORAL_TABLET | Freq: Two times a day (BID) | ORAL | Status: DC

## 2016-04-21 NOTE — Discharge Instructions (Signed)
It was a pleasure to see you today.  The x-ray of the right wrist is negative for fractures.   I recommend you keep the appointment with Dr Margaretha Sheffieldraper (Sports Medicine) on Monday, May 22nd at 9am; you also have an appointment with Dr Armen PickupFunches (your primary doctor) on the same day at 10:30am.   You may continue to use the right wrist splint until the visit.    I am changing your medicine.  STOP taking the diclofenac.   START taking NAPROXEN 500mg , take 1 tablet by mouth two times daily with food.

## 2016-04-21 NOTE — ED Provider Notes (Addendum)
CSN: [id number]     Arrival date & time 04/21/16  1550 History   First MD Initiated Contact with Patient 04/21/16 1737     Chief Complaint  Patient presents with  . Hand Pain   (Consider location/radiation/quality/duration/timing/severity/associated sxs/prior Treatment) Patient is a 40 y.o. male presenting with hand pain. The history is provided by the patient. The history is limited by a language barrier. A language interpreter was used.  Hand Pain This is a chronic problem.   Arabic telephone interpreter for entirety of this visit. Patient presents with complaint of continued pain in R wrist, [facility] for same complaint and given wrist splint, cataflam, and told to follow up.  He reports improvement in pain in the right elbow and shoulder, but not in the right wrist.  Cracks when he flexes/extends the R wrist.    No new injury or inicident of overuse.  Has taken the NSAID with some improvement.  He reports that he has an appointment with [facility] in 3 days on Monday, May 22nd.  Past Medical History  Diagnosis Date  . Asthma    History reviewed. No pertinent past surgical history. Family History  Problem Relation Age of Onset  . Family history unknown: Yes   Social History  Substance Use Topics  . Smoking status: Never Smoker   . Smokeless tobacco: Never Used  . Alcohol Use: No    Review of Systems  Constitutional: Negative for fever, chills and fatigue.  Musculoskeletal: Positive for arthralgias. Negative for joint swelling.    Allergies  Review of patient's allergies indicates no known allergies.  Home Medications   Prior to Admission medications   Medication Sig Start Date End Date Taking? Authorizing Provider  albuterol (PROVENTIL HFA;VENTOLIN HFA) 108 (90 Base) MCG/ACT inhaler Inhale 2 puffs into the lungs every 6 (six) hours as needed for wheezing or shortness of breath. 01/24/16  Yes Josalyn Funches, MD  diclofenac  (CATAFLAM) 50 MG tablet Take 1 tablet (50 mg total) by mouth 3 (three) times daily. One tablet TID with food prn pain. 04/07/16   Hayden Rasmussenavid Mabe, NP  ibuprofen (ADVIL,MOTRIN) 600 MG tablet Take 1 tablet (600 mg total) by mouth every 8 (eight) hours as needed. 03/06/16   Ralene Corkimothy R Draper, DO   Meds Ordered and Administered this Visit  Medications - No data to display  BP 110/78 mmHg  Pulse 60  Temp(Src) 98 F (36.7 C) (Oral)  Resp 16  SpO2 100% No data found.   Physical Exam  Constitutional: He appears well-developed and well-nourished. No distress.  Neck: Normal range of motion. Neck supple.  Musculoskeletal:  No active synovitis noted in hands or wrists.   Full active ROM R wrist.  Positive Finkelsteins test on R. Palpable radial pulses bilaterally.   RIGHT ELBOW with tenderness along lateral and medial epicondyles.  RIGHT SHOULDER full active ROM; tenderness along posterior aspect of shoulder with internal rotation but is able to internally rotate.   Lymphadenopathy:    He has no cervical adenopathy.  Neurological:  Sensation in distal finger tips of both hands full and symmetric.   Handgrip in L is full 5/5; in the R it is 4/5 (limited by pain).   Skin: He is not diaphoretic.    ED Course  Procedures (including critical care time)  Labs Review Labs Reviewed - No data to display  Imaging Review No results found.   Visual Acuity Review  Right Eye  Distance:   Left Eye Distance:   Bilateral Distance:    Right Eye Near:   Left Eye Near:    Bilateral Near:         MDM   1. Chronic wrist pain, right   2. Tenosynovitis, de Quervain    XR right wrist, films reviewed by me, no evidence of fracture.    Patient has an appointment on Monday (in 3 days) [facility] (Dr. Annye Rusk) and with his primary doctor Dr Armen Pickup on the same day at 10:30am; he is advised to keep this appointment for further evaluation of this chronic right wrist pain. May continue  to use NSAID and wrist splint.     Barbaraann Barthel, MD 04/21/16 8119  Barbaraann Barthel, MD 04/21/16 [phone]
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
## 2016-04-24 ENCOUNTER — Encounter: Payer: Self-pay | Admitting: Family Medicine

## 2016-04-24 ENCOUNTER — Encounter: Payer: Self-pay | Admitting: Sports Medicine

## 2016-04-24 ENCOUNTER — Ambulatory Visit (INDEPENDENT_AMBULATORY_CARE_PROVIDER_SITE_OTHER): Payer: Medicaid Other | Admitting: Sports Medicine

## 2016-04-24 ENCOUNTER — Ambulatory Visit: Payer: Medicaid Other | Attending: Family Medicine | Admitting: Family Medicine

## 2016-04-24 ENCOUNTER — Other Ambulatory Visit: Payer: Self-pay | Admitting: Sports Medicine

## 2016-04-24 ENCOUNTER — Ambulatory Visit
Admission: RE | Admit: 2016-04-24 | Discharge: 2016-04-24 | Disposition: A | Discharge status: Home / Self Care | Payer: Medicaid Other | Source: Ambulatory Visit | Attending: Sports Medicine | Admitting: Sports Medicine

## 2016-04-24 VITALS — BP 107/71 | HR 67 | Temp 98.4°F | Resp 16 | Ht 69.0 in | Wt 171.0 lb

## 2016-04-24 VITALS — BP 116/71 | HR 70 | Ht 69.0 in | Wt 177.0 lb

## 2016-04-24 DIAGNOSIS — R1032 Left lower quadrant pain: Secondary | ICD-10-CM | POA: Diagnosis not present

## 2016-04-24 DIAGNOSIS — J309 Allergic rhinitis, unspecified: Secondary | ICD-10-CM | POA: Insufficient documentation

## 2016-04-24 DIAGNOSIS — J4520 Mild intermittent asthma, uncomplicated: Secondary | ICD-10-CM | POA: Diagnosis not present

## 2016-04-24 DIAGNOSIS — Z7952 Long term (current) use of systemic steroids: Secondary | ICD-10-CM | POA: Diagnosis not present

## 2016-04-24 DIAGNOSIS — M25562 Pain in left knee: Secondary | ICD-10-CM

## 2016-04-24 DIAGNOSIS — M25531 Pain in right wrist: Secondary | ICD-10-CM | POA: Diagnosis not present

## 2016-04-24 DIAGNOSIS — Z791 Long term (current) use of non-steroidal anti-inflammatories (NSAID): Secondary | ICD-10-CM | POA: Insufficient documentation

## 2016-04-24 DIAGNOSIS — R1912 Hyperactive bowel sounds: Secondary | ICD-10-CM

## 2016-04-24 DIAGNOSIS — R490 Dysphonia: Secondary | ICD-10-CM | POA: Diagnosis not present

## 2016-04-24 DIAGNOSIS — Z79899 Other long term (current) drug therapy: Secondary | ICD-10-CM | POA: Diagnosis not present

## 2016-04-24 DIAGNOSIS — R198 Other specified symptoms and signs involving the digestive system and abdomen: Secondary | ICD-10-CM | POA: Insufficient documentation

## 2016-04-24 DIAGNOSIS — G8929 Other chronic pain: Secondary | ICD-10-CM

## 2016-04-24 MED ORDER — PREDNISONE 10 MG PO TABS: ORAL_TABLET | ORAL | Status: DC

## 2016-04-24 MED ORDER — ALBUTEROL SULFATE HFA 108 (90 BASE) MCG/ACT IN AERS: 2.0000 | INHALATION_SPRAY | Freq: Four times a day (QID) | RESPIRATORY_TRACT | Status: DC | PRN

## 2016-04-24 MED ORDER — CETIRIZINE HCL 10 MG PO TABS: 10.0000 mg | ORAL_TABLET | Freq: Every day | ORAL | Status: DC | PRN

## 2016-04-24 MED ORDER — FLUTICASONE PROPIONATE 50 MCG/ACT NA SUSP: 2.0000 | Freq: Every day | NASAL | Status: DC

## 2016-04-24 NOTE — Assessment & Plan Note (Signed)
X one year.  ENT referral for direct laryngoscopy

## 2016-04-24 NOTE — Patient Instructions (Addendum)
Ernest Griffin was seen today for nose problem.  Diagnoses and all orders for this visit:  Allergic rhinitis, unspecified allergic rhinitis type -     fluticasone (FLONASE) 50 MCG/ACT nasal spray; Place 2 sprays into both nostrils at bedtime. -     cetirizine (ZYRTEC) 10 MG tablet; Take 1 tablet (10 mg total) by mouth daily as needed for allergies.  Asthma, mild intermittent, uncomplicated -     albuterol (PROVENTIL HFA;VENTOLIN HFA) 108 (90 Base) MCG/ACT inhaler; Inhale 2 puffs into the lungs every 6 (six) hours as needed for wheezing or shortness of breath.  Hoarseness -     Ambulatory referral to ENT  [place] -     Ambulatory referral to Gastroenterology  Left lower quadrant pain   F/u in 2 months for allergies, hoarseness, LL abdominal pain   Dr. Armen PickupFunches

## 2016-04-24 NOTE — Assessment & Plan Note (Signed)
A: LLQ pain that is intermittent with loud bowel sounds. Patient requesting colonoscopy. No significant weight loss. No improvement with removing lactose for diet. IBS or food intolerance most likely.   P: GI referral
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
## 2016-04-24 NOTE — Assessment & Plan Note (Signed)
A: allergic rhinitis P: Zyrtec flonase Reassurance

## 2016-04-24 NOTE — Progress Notes (Signed)
Subjective:  Patient ID: Ernest Griffin, male    DOB: 02/19/1976  Age: 40 y.o. MRN: [id number] Arabic interpreter used   CC: Nose Problem   HPI Yi Daden Mahany presents for    1. Runny nose: symptoms first noted 10 years ago . Comes and goes. Last one week at at time. Currently has mild headache and sometimes fever subjective, frequent sneezing, runny nose sometimes. Has tried OTC phenadone (chlopheniramine maleate + dexamethasone ) syrup while in [place] and zyrtec in the past which would helped.  2. Hoarseness: x one year. Comes and goes. Intermittent cough. No fever or chill. He denies reflux, CP and upper abdominal pain.  3. Lower abdominal noises: x 15 months. He has removed lactose from diet without improvement. He has intermittent LLQ pain with urgent need to have BM. No fever or chill. No blood in stool. No diarrhea. He is concerned about his liver and colon.    Social History  Substance Use Topics  . Smoking status: Never Smoker   . Smokeless tobacco: Never Used  . Alcohol Use: No    Outpatient Prescriptions Prior to Visit  Medication Sig Dispense Refill  . albuterol (PROVENTIL HFA;VENTOLIN HFA) 108 (90 Base) MCG/ACT inhaler Inhale 2 puffs into the lungs every 6 (six) hours as needed for wheezing or shortness of breath. 1 Inhaler 0  . diclofenac (CATAFLAM) 50 MG tablet Reported on 04/24/2016  0  . ibuprofen (ADVIL,MOTRIN) 600 MG tablet Take 1 tablet (600 mg total) by mouth every 8 (eight) hours as needed. (Patient not taking: Reported on 04/24/2016) 60 tablet 0  . naproxen (NAPROSYN) 500 MG tablet Take 1 tablet (500 mg total) by mouth 2 (two) times daily with a meal. (Patient not taking: Reported on 04/24/2016) 30 tablet 0  . predniSONE (DELTASONE) 10 MG tablet Take as directed, per information sheet (Patient not taking: Reported on 04/24/2016) 21 tablet 0   No facility-administered medications prior to visit.    ROS Review of Systems  Constitutional: Negative for fever,  chills, fatigue and unexpected weight change.  HENT: Positive for rhinorrhea, sinus pressure, sneezing and voice change.   Eyes: Negative for visual disturbance.  Respiratory: Negative for cough and shortness of breath.   Cardiovascular: Negative for chest pain, palpitations and leg swelling.  Gastrointestinal: Positive for abdominal pain. Negative for nausea, vomiting, diarrhea, constipation and blood in stool.  Endocrine: Negative for polydipsia, polyphagia and polyuria.  Musculoskeletal: Negative for myalgias, back pain, arthralgias, gait problem and neck pain.  Skin: Negative for rash.  Allergic/Immunologic: Negative for immunocompromised state.  Hematological: Negative for adenopathy. Does not bruise/bleed easily.  Psychiatric/Behavioral: Negative for suicidal ideas, sleep disturbance and dysphoric mood. The patient is not nervous/anxious.     Objective:  BP 107/71 mmHg  Pulse 67  Temp(Src) 98.4 F (36.9 C) (Oral)  Resp 16  Ht  (1.753 m)  Wt 171 lb (77.565 kg)  BMI 25.24 kg/m2  SpO2 100%  BP/Weight 04/24/2016 04/24/2016 04/21/2016  Systolic BP 107 116 110  Diastolic BP 71 71 78  Wt. (Lbs) 171 177 -  BMI 25.24 26.13 -   Physical Exam  Constitutional: He appears well-developed and well-nourished. No distress.  HENT:  Head: Normocephalic and atraumatic.  Right Ear: External ear normal.  Left Ear: External ear normal.  Nose: Mucosal edema present.  Mouth/Throat: Oropharynx is clear and moist. No oropharyngeal exudate.  Eyes: Conjunctivae and EOM are normal. Pupils are equal, round, and reactive to light. Right eye exhibits no  discharge. Left eye exhibits no discharge. No scleral icterus.  Neck: Normal range of motion. Neck supple. No thyromegaly present.  Cardiovascular: Normal rate, regular rhythm, normal heart sounds and intact distal pulses.   Pulmonary/Chest: Effort normal and breath sounds normal.  Abdominal: Soft. Bowel sounds are normal. He exhibits no distension  and no mass. There is no tenderness. There is no rebound and no guarding.  Musculoskeletal: He exhibits no edema.  Lymphadenopathy:    He has no cervical adenopathy.  Neurological: He is alert.  Skin: Skin is warm and dry. No rash noted. No erythema.  Psychiatric: He has a normal mood and affect.     Chemistry      Component Value Date/Time   NA 140 01/17/2016 1114   K 3.9 01/17/2016 1114   CL 105 01/17/2016 1114   CO2 27 01/17/2016 1114   BUN 10 01/17/2016 1114   CREATININE 0.91 01/17/2016 1114      Component Value Date/Time   CALCIUM 9.1 01/17/2016 1114   ALKPHOS 51 01/17/2016 1114   AST 21 01/17/2016 1114   ALT 23 01/17/2016 1114   BILITOT 0.3 01/17/2016 1114      Assessment & Plan:   There are no diagnoses linked to this encounter. Alyan was seen today for nose problem.  Diagnoses and all orders for this visit:  Allergic rhinitis, unspecified allergic rhinitis type -     fluticasone (FLONASE) 50 MCG/ACT nasal spray; Place 2 sprays into both nostrils at bedtime. -     cetirizine (ZYRTEC) 10 MG tablet; Take 1 tablet (10 mg total) by mouth daily as needed for allergies.  Asthma, mild intermittent, uncomplicated -     albuterol (PROVENTIL HFA;VENTOLIN HFA) 108 (90 Base) MCG/ACT inhaler; Inhale 2 puffs into the lungs every 6 (six) hours as needed for wheezing or shortness of breath.  Hoarseness -     Ambulatory referral to ENT  Borborygmi -     Ambulatory referral to Gastroenterology  Left lower quadrant pain    Meds ordered this encounter  Medications  . fluticasone (FLONASE) 50 MCG/ACT nasal spray    Sig: Place 2 sprays into both nostrils at bedtime.    Dispense:  16 g    Refill:  6  . cetirizine (ZYRTEC) 10 MG tablet    Sig: Take 1 tablet (10 mg total) by mouth daily as needed for allergies.    Dispense:  30 tablet    Refill:  11  . albuterol (PROVENTIL HFA;VENTOLIN HFA) 108 (90 Base) MCG/ACT inhaler    Sig: Inhale 2 puffs into the lungs every 6 (six) hours  as needed for wheezing or shortness of breath.    Dispense:  1 Inhaler    Refill:  6    Follow-up: No Follow-up on file.   Dessa PhiJosalyn Waymond Meador MD

## 2016-04-24 NOTE — Progress Notes (Signed)
Complaining of possible sinuses infection  Stated been having running nose x 1 week  No tobacco user  No suicidal thoughts in the past two weeks  Pain scale # 3 - HA   Use video Interpreter Arabic #[id number]

## 2016-05-15 ENCOUNTER — Ambulatory Visit (INDEPENDENT_AMBULATORY_CARE_PROVIDER_SITE_OTHER): Payer: Medicaid Other | Admitting: Sports Medicine

## 2016-05-15 ENCOUNTER — Telehealth: Payer: Self-pay | Admitting: Family Medicine

## 2016-05-15 ENCOUNTER — Encounter: Payer: Self-pay | Admitting: Sports Medicine

## 2016-05-15 VITALS — BP 108/76 | HR 74 | Ht 69.0 in | Wt 171.0 lb

## 2016-05-15 DIAGNOSIS — M25562 Pain in left knee: Secondary | ICD-10-CM

## 2016-05-15 DIAGNOSIS — M25531 Pain in right wrist: Secondary | ICD-10-CM | POA: Diagnosis not present

## 2016-05-15 MED ORDER — DICLOFENAC SODIUM 75 MG PO TBEC: 75.0000 mg | DELAYED_RELEASE_TABLET | Freq: Two times a day (BID) | ORAL | Status: DC

## 2016-05-15 MED FILL — DICLOFENAC SOD DR 75 MG TAB: 75 | 30 days supply | Qty: 60 | Fill #0

## 2016-05-15 NOTE — Progress Notes (Addendum)
Patient ID: Ernest Griffin, male   DOB: 12/30/1975, 40 y.o.   MRN: [id number]  CC: F/u L knee pain, R hand pain  HPI: Ernest Griffin is a 40 yo male with noncontributory PMH presenting for f/u L knee pain and new R wrist/finger pain.   History obtained through interpreter.  Pt states since his last visit in April he has developed worsened R wrist pain and pain in two fingers on same hand (points to PIP joints of 3rd and 4th digits).  This pain has been present for years, and in fact he had weekly injections for two months three years ago in [place].  Injections helped pain.  He said his diagnosis at the time was "inflammation."  Had low-level pain since these stopped, but worsened over last month.  Aching pain worst with strong handgrip, works in [employer] and has to do a lot of repetitive motion with hands.  No history of injury to area.  Oral diclofenac has helped.   L knee pain was evaluated in April, determined to be structurally intact with unclear source of pain.  Pain is "inside knee," no history of injury to area. Pain since then has been about the same, however was provided with a burst of prednisone that helped.      ROS: Gen: - fevers/chills MSK: + R ankle pain, otherwise - except as per HPI Neuro: normal gait  PE: BP 108/76 mmHg  Pulse 74  Ht 5\' 9"  (1.753 m)  Wt 171 lb (77.565 kg)  BMI 25.24 kg/m2 Gen: 40 yo gentleman sitting on exam table in NAD HEENT: normocephalic, atraumatic Pulm: normal work of breathing MSK:  RUE: inspection of shoulder, elbow, wrist, hand negative for swelling, obvious deformity, or overlying skin changes, full active ROM throughout, non-TTP over all bony landmarks, strength 5/5 throughout (biceps/triceps/finger abduction/hand grip), gross sensation intact LLE: inspection of knee reveals no obvious swelling, bony/articularly irregularity, or overlying skin changes, full active ROM, non-TTP throughout, strength 5/5 throughout, sensation grossly  intact  A/P     Ernest Griffin is a 40 yo male presenting with R wrist and R 3rd/4th PIP joint tenderness of unclear etiology and f/u L knee pain concerning for patellofemoral pain syndrome.    R wrist/hand Unclear etiology, prior X-rays negative -- Will obtain MRI to eval for soft-tissue etiology, will call with results -- Refill oral diclofenac -- Can continue using hand -- Will provide note for light duty at work given pain  L knee pain Concerning for patellofemoral pain syndrome, unlikely structural etiology given benign exam and prior X-ray -- Recent X-rays - for arthritic changes, mild lateral patellar tilt -- Schedule for one PT visit for teaching on knee exercises -- F/u PRN  Ernest Griffin, MS4   Patient seen and evaluated with medical student. I agree with the above plan of care. 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 I will send him for a single physical therapy visit. Right wrist pain remains unclear. I will get an MRI to further evaluate the soft tissue structures around the right wrist. I've refilled his diclofenac to take as needed. I provided him with a work note which asked that they limit use of the right hand until I review the MRI. Phone follow-up after the MRI to discuss those results and delineate further treatment..Marland Kitchen

## 2016-05-15 NOTE — Telephone Encounter (Signed)
Patient came into office requesting ophthalmology referral, please f/up

## 2016-05-18 NOTE — Telephone Encounter (Signed)
What is the requested referral for? Ernest does not have diabetes Ernest Griffin?

## 2016-05-21 ENCOUNTER — Ambulatory Visit
Admission: RE | Admit: 2016-05-21 | Discharge: 2016-05-21 | Disposition: A | Discharge status: Home / Self Care | Payer: Medicaid Other | Source: Ambulatory Visit | Attending: Sports Medicine | Admitting: Sports Medicine

## 2016-05-21 DIAGNOSIS — M25531 Pain in right wrist: Secondary | ICD-10-CM

## 2016-05-22 ENCOUNTER — Encounter: Payer: Self-pay | Admitting: Sports Medicine

## 2016-05-22 ENCOUNTER — Ambulatory Visit (INDEPENDENT_AMBULATORY_CARE_PROVIDER_SITE_OTHER): Payer: Medicaid Other | Admitting: Sports Medicine

## 2016-05-22 VITALS — BP 97/65 | Ht 69.0 in | Wt 171.0 lb

## 2016-05-22 DIAGNOSIS — G8929 Other chronic pain: Secondary | ICD-10-CM | POA: Diagnosis not present

## 2016-05-22 DIAGNOSIS — M25531 Pain in right wrist: Secondary | ICD-10-CM

## 2016-05-22 NOTE — Progress Notes (Signed)
Patient ID: Ernest Griffin, Ernest Griffin   DOB: 04/09/1976, 40 y.o.   MRN: [id number]  Patient comes in today to discuss MRI findings of his right wrist. MRI is unremarkable. 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 Today he has very little pain. The diclofenac has been very helpful. He is still having knee pain but has yet to attend physical therapy. We've provided him with the information for [facility] and he will go directly to their office to schedule his visit. Follow-up with me as needed.  Total time spent with the patient was 10 minutes with greater than 50% of the time spent in face-to-face consultation discussing further treatment.

## 2016-05-24 NOTE — Telephone Encounter (Signed)
Used pacific interpreter Arabic [phone]#[id number] LVM to return call

## 2016-05-30 ENCOUNTER — Ambulatory Visit: Payer: Medicaid Other | Admitting: Physical Therapy

## 2016-06-19 ENCOUNTER — Ambulatory Visit: Payer: Medicaid Other | Attending: Sports Medicine | Admitting: Physical Therapy

## 2016-06-19 ENCOUNTER — Encounter: Payer: Self-pay | Admitting: Physical Therapy

## 2016-06-19 DIAGNOSIS — M6281 Muscle weakness (generalized): Secondary | ICD-10-CM | POA: Insufficient documentation

## 2016-06-19 DIAGNOSIS — M25562 Pain in left knee: Secondary | ICD-10-CM | POA: Insufficient documentation

## 2016-06-20 NOTE — Therapy (Signed)
[facility] [address] [place], [place], [address] Phone: [phone]   Fax:  [phone]  Physical Therapy Evaluation  Patient Details  Name: Ernest Griffin MRN: [id number] Date of Birth: 09/27/1976 Referring Provider: Dr Reino Bellisimothy  Draper   Encounter Date: 06/19/2016      PT End of Session - 06/19/16 1142    Visit Number 1   Number of Visits 1   Authorization Type medicaid    Authorization Time Period 1x visit    PT Start Time 0849   PT Stop Time 0940   PT Time Calculation (min) 51 min   Activity Tolerance Patient tolerated treatment well   Behavior During Therapy [facility] for tasks assessed/performed      Past Medical History  Diagnosis Date  . Asthma     History reviewed. No pertinent past surgical history.  There were no vitals filed for this visit.       Subjective Assessment - 06/19/16 1116    Subjective Patient fell going down the stairs in [place] last year. He hit his left knee. He has had pain from that point on. He had an MRI in [place] but can not recall the results. The psain increase when he stands. he has to stand at work. The pain has increased over time.    Patient is accompained by: Interpreter   Pertinent History No histroy of knee pain    Limitations Standing;Walking;Lifting   How long can you sit comfortably? N/A    How long can you stand comfortably? > 20 min    How long can you walk comfortably? Limited community distances    Diagnostic tests Left knee x-ray: (-) for acute    Currently in Pain? Yes   Pain Score 4    Pain Location Knee   Pain Orientation Right   Pain Descriptors / Indicators Aching   Pain Onset More than a month ago   Pain Frequency Intermittent   Aggravating Factors  walking, standing, bending knee   Pain Relieving Factors rest    Effect of Pain on Daily Activities difficulty perfroming work tasks.             [facility] - 06/20/16 0001    Assessment   Medical  Diagnosis Left knee pain    Referring Provider Dr Reino Bellisimothy  Draper    Onset Date/Surgical Date --  > 1 year ago   Hand Dominance Right   Next MD Visit Has not made an appontment    Prior Therapy No   Precautions   Precautions None   Restrictions   Weight Bearing Restrictions No   Home Environment   Additional Comments Patient has 12 steps to get into his house.    Prior Function   Vocation Full time employment   Vocation Requirements Stands packing chicken at [employer]   Cognition   Overall Cognitive Status Within Functional Limits for tasks assessed   Observation/Other Assessments   Focus on Therapeutic Outcomes (FOTO)  Medicaid eval    Coordination   Gross Motor Movements are Fluid and Coordinated Yes   Strength   Left Hip Flexion 4+/5   Left Hip Extension 5/5   Left Hip ABduction 5/5   Left Hip ADduction 5/5   Right Knee Flexion 5/5   Right Knee Extension 5/5   Left Knee Flexion 5/5   Left Knee Extension 4+/5   Flexibility   Soft Tissue Assessment /Muscle Length yes   Quadriceps minimal tightness in  hamstrings    Palpation   Patella mobility Normal   Special Tests    Special Tests Knee Special Tests   Knee Special tests  other   other    Comments decreased single leg stabi,ity on the left; mcmurrays (+) medial.    Ambulation/Gait   Gait Comments No significant gait deviations noted                    [facility] Adult PT Treatment/Exercise - 06/20/16 0001    Exercises   Exercises Knee/Hip   Knee/Hip Exercises: Standing   Heel Raises Limitations x10   Hip Flexion Limitations 10   Abduction Limitations x10 bilateral    Extension Limitations x10 bilateral    Functional Squat Limitations x10 cuing for technqiue    Knee/Hip Exercises: Seated   Long Arc Quad Limitations x10 w/ yellow band    Hamstring Limitations x10 yellow    Knee/Hip Exercises: Supine   Bridges Limitations x10    Straight Leg Raises Limitations x10    Modalities   Modalities  Cryotherapy   Cryotherapy   Number Minutes Cryotherapy 10 Minutes   Cryotherapy Location Knee   Type of Cryotherapy Ice pack                PT Education - 06/19/16 1121    Education provided Yes   Education Details Patient only has 1 visit per medicaid. Patient given complete HEP with education on symptom mangement.    Person(s) Educated Patient   Methods Explanation;Demonstration;Verbal cues;Handout   Comprehension Verbalized understanding;Returned demonstration          PT Short Term Goals - 06/20/16 7846    PT SHORT TERM GOAL #1   Title Patient will be I w/ HEP for quad and hip strengthening    Time 1   Period Days   Status Achieved   PT SHORT TERM GOAL #2   Title Patient will restate the importance of RICE    Time 1   Period Days   Status Achieved                  Plan - 06/20/16 0835    Clinical Impression Statement Patient is a 40 year old male with left knee pain after a fall. He shows signs of medial meniscal pathology. He has some weakness of the quad at this time on the left compared to the right. He has pain when standing at work. He was given an HEP and education on symptom mangement through the interpreter. He was seen for a 1x visit per medicaid guidlines.    Rehab Potential Good   PT Frequency One time visit   PT Next Visit Plan 1x visit    PT Home Exercise Plan see Inctructions       Patient will benefit from skilled therapeutic intervention in order to improve the following deficits and impairments:  Decreased strength, Pain  Visit Diagnosis: Pain in left knee - Plan: PT plan of care cert/re-cert  Muscle weakness (generalized) - Plan: PT plan of care cert/re-cert     Problem List Patient Active Problem List   Diagnosis Date Noted  . Hoarseness 04/24/2016  . Borborygmi 04/24/2016  . Rhinitis, allergic 04/24/2016  . Abdominal pain 01/17/2016  . Left knee pain 01/17/2016  . Asthma 01/17/2016  . Joint pain in fingers of right  hand 01/17/2016    Dessie Coma PT DPT  06/20/2016, 8:45 AM  [facility] [phone]  [address] [place], [place], [address] Phone: [phone]   Fax:  [phone]  Name: Ernest Griffin MRN: [id number] Date of Birth: 15-Jan-1976

## 2016-07-11 ENCOUNTER — Ambulatory Visit (INDEPENDENT_AMBULATORY_CARE_PROVIDER_SITE_OTHER): Payer: Medicaid Other | Admitting: Sports Medicine

## 2016-07-11 ENCOUNTER — Encounter: Payer: Self-pay | Admitting: Sports Medicine

## 2016-07-11 DIAGNOSIS — M25531 Pain in right wrist: Secondary | ICD-10-CM | POA: Diagnosis not present

## 2016-07-11 MED ORDER — DICLOFENAC SODIUM 75 MG PO TBEC: 75.0000 mg | DELAYED_RELEASE_TABLET | Freq: Two times a day (BID) | ORAL | 1 refills | Status: DC

## 2016-07-11 NOTE — Progress Notes (Signed)
   Subjective:    Patient ID: Ernest Griffin, male    DOB: 08/17/1976, 40 y.o.   MRN: [id number]  HPI   Patient comes in today with returning right wrist pain. Recent MRI of the wrist was unremarkable. His pain had completely re position at work but unfortunately that has not happened. Historysolved on diclofenac but he has run out of this medicine. His pain is still primarily presnt with work-related activity. He was hoping that they would be able to find him a different position at work but unfortunately that has not happened. He denies numbness or tingling. Please note, that the history is obtained with the help of an interpreter.     Review of Systems     Objective:   Physical Exam Well-developed, well-nourished. Sitting comfortably in the exam room.  Right wrist: Full range of motion. No effusion. No soft tissue swelling. Mild tenderness to palpation across the dorsum of the wrist but nothing focal. Excellent grip strength. Good radial ulnar pulses. Negative Tinel's at the carpal tunnel.  MRI of the right wrist is as above        Assessment & Plan:   Returning right wrist pain with unremarkable MRI  Patient is once again reassured of his MRI findings. I have refilled his diclofenac since it is been helpful in the past. I once again explained to him that a change in position at work may help his wrist pain since all of his pain seems to be work-related but I cannot make specific recommendations as to what this change in position might be. I think he is safe to continue with activity as tolerated and follow-up with me as needed.

## 2016-07-18 ENCOUNTER — Ambulatory Visit: Payer: Medicaid Other | Admitting: Family Medicine

## 2016-07-20 ENCOUNTER — Encounter: Payer: Self-pay | Admitting: Sports Medicine

## 2016-08-28 ENCOUNTER — Telehealth: Payer: Self-pay | Admitting: Family Medicine

## 2016-08-28 ENCOUNTER — Other Ambulatory Visit: Payer: Self-pay | Admitting: *Deleted

## 2016-08-28 DIAGNOSIS — M25531 Pain in right wrist: Secondary | ICD-10-CM

## 2016-08-28 DIAGNOSIS — J309 Allergic rhinitis, unspecified: Secondary | ICD-10-CM

## 2016-08-28 MED ORDER — DICLOFENAC SODIUM 75 MG PO TBEC: 75.0000 mg | DELAYED_RELEASE_TABLET | Freq: Two times a day (BID) | ORAL | 2 refills | Status: DC

## 2016-08-28 MED ORDER — CETIRIZINE HCL 10 MG PO TABS: 10.0000 mg | ORAL_TABLET | Freq: Every day | ORAL | 3 refills | Status: DC | PRN

## 2016-08-28 NOTE — Telephone Encounter (Signed)
Patient needs a refill for centrizine and diclofenac. Please follow up.

## 2016-08-28 NOTE — Telephone Encounter (Signed)
Refilled cetirizine. Diclofenac was ordered by Dr. Margaretha Sheffieldraper - will forward request to him.

## 2016-09-07 ENCOUNTER — Encounter: Payer: Self-pay | Admitting: Family Medicine

## 2016-09-07 ENCOUNTER — Ambulatory Visit: Payer: Medicaid Other | Attending: Family Medicine | Admitting: Family Medicine

## 2016-09-07 VITALS — BP 114/76 | HR 70 | Temp 98.1°F | Ht 69.0 in | Wt 179.2 lb

## 2016-09-07 DIAGNOSIS — H1013 Acute atopic conjunctivitis, bilateral: Secondary | ICD-10-CM | POA: Insufficient documentation

## 2016-09-07 DIAGNOSIS — H5461 Unqualified visual loss, right eye, normal vision left eye: Secondary | ICD-10-CM | POA: Diagnosis not present

## 2016-09-07 DIAGNOSIS — H547 Unspecified visual loss: Secondary | ICD-10-CM | POA: Diagnosis not present

## 2016-09-07 MED ORDER — KETOTIFEN FUMARATE 0.025 % OP SOLN: 1.0000 [drp] | Freq: Two times a day (BID) | OPHTHALMIC | 1 refills | Status: DC

## 2016-09-07 MED FILL — DICLOFENAC SOD DR 75 MG TAB: 75 | 30 days supply | Qty: 60 | Fill #0

## 2016-09-07 NOTE — Assessment & Plan Note (Signed)
zatidor eye drops

## 2016-09-07 NOTE — Progress Notes (Signed)
Pt needs referral to eye doctor.  Pt got flu shot from health department.

## 2016-09-07 NOTE — Assessment & Plan Note (Signed)
Opthalmology referral placed  
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
## 2016-09-07 NOTE — Patient Instructions (Addendum)
Ernest Griffin was seen today for referral.  Diagnoses and all orders for this visit:  Decreased vision of right eye -     Ambulatory referral to Ophthalmology    F.u in 6 weeks for vision check up  Dr .Armen PickupFunches
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
## 2016-09-22 ENCOUNTER — Telehealth: Payer: Self-pay | Admitting: Family Medicine

## 2016-09-22 NOTE — Telephone Encounter (Signed)
Patient called the office to request medication refill for   albuterol (PROVENTIL HFA;VENTOLIN HFA) 108 (90 Base) MCG/ACT inhaler   cetirizine (ZYRTEC) 10 MG tablet   [facility] [address]   Thank you.

## 2016-09-25 MED ORDER — CETIRIZINE HCL 10 MG PO TABS: 10.0000 mg | ORAL_TABLET | Freq: Every day | ORAL | 3 refills | Status: DC | PRN

## 2016-09-25 MED ORDER — ALBUTEROL SULFATE HFA 108 (90 BASE) MCG/ACT IN AERS: 2.0000 | INHALATION_SPRAY | Freq: Four times a day (QID) | RESPIRATORY_TRACT | 6 refills | Status: DC | PRN

## 2016-09-25 NOTE — Telephone Encounter (Signed)
Requested medications refilled 

## 2017-04-18 ENCOUNTER — Encounter: Payer: Self-pay | Admitting: Family Medicine

## 2017-05-09 ENCOUNTER — Telehealth: Payer: Self-pay | Admitting: Family Medicine

## 2017-05-09 NOTE — Telephone Encounter (Signed)
Will route to PCP 

## 2017-05-09 NOTE — Telephone Encounter (Signed)
PT came to the office requesting a refill for the pill (he does not the name) for the stomach that the PCP prescribe him before, please follow up with PT

## 2017-05-10 NOTE — Telephone Encounter (Signed)
Patient has not been prescribed medication specifically for his stomach He has been prescribed antiinflammatories for joint pan Allergy medicine Albuterol for cough.  Please call to patient to ask if he can find the name of the medicine and what symptoms he is having. May need OV.

## 2017-05-27 ENCOUNTER — Ambulatory Visit (HOSPITAL_COMMUNITY)
Admission: EM | Admit: 2017-05-27 | Discharge: 2017-05-27 | Disposition: A | Discharge status: Home / Self Care | Payer: BLUE CROSS/BLUE SHIELD | Attending: Family Medicine | Admitting: Family Medicine

## 2017-05-27 ENCOUNTER — Encounter (HOSPITAL_COMMUNITY): Payer: Self-pay | Admitting: Physician Assistant

## 2017-05-27 DIAGNOSIS — S29012A Strain of muscle and tendon of back wall of thorax, initial encounter: Secondary | ICD-10-CM | POA: Diagnosis not present

## 2017-05-27 DIAGNOSIS — J45901 Unspecified asthma with (acute) exacerbation: Secondary | ICD-10-CM | POA: Diagnosis not present

## 2017-05-27 DIAGNOSIS — J00 Acute nasopharyngitis [common cold]: Secondary | ICD-10-CM

## 2017-05-27 MED ORDER — ALBUTEROL SULFATE HFA 108 (90 BASE) MCG/ACT IN AERS: 2.0000 | INHALATION_SPRAY | Freq: Four times a day (QID) | RESPIRATORY_TRACT | 0 refills | Status: DC | PRN

## 2017-05-27 MED ORDER — NAPROXEN 500 MG PO TABS: 500.0000 mg | ORAL_TABLET | Freq: Two times a day (BID) | ORAL | 0 refills | Status: AC

## 2017-05-27 MED ORDER — PREDNISONE 10 MG (21) PO TBPK: ORAL_TABLET | Freq: Every day | ORAL | 0 refills | Status: DC

## 2017-05-27 NOTE — ED Provider Notes (Signed)
CSN: [id number]     Arrival date & time 05/27/17  1812 History   None    Chief Complaint  Patient presents with  . Wheezing  . Cough   (Consider location/radiation/quality/duration/timing/severity/associated sxs/prior Treatment) 41 yo male with PMH of asthma comes in with multiple complaints  1. Cough and wheezing: Patient has had cough for the past week with some sore throat. Denies fever, chills, night sweats. Denies ear pain, eye pain, sinus pressure. The coughing is most significant at night time, causing him to be restless. Has been using his albuterol inhaler more often due to the coughing and the wheezing.   2. Left back pain: Patient states pain on the left upper back for the past month with movement. He experiences the pain especially when moving and lifting heavy objects. He has not done anything for it. Does not experience pain at rest.       Past Medical History:  Diagnosis Date  . Asthma    History reviewed. No pertinent surgical history. Family History  Problem Relation Age of Onset  . Family history unknown: Yes   Social History  Substance Use Topics  . Smoking status: Never Smoker  . Smokeless tobacco: Never Used  . Alcohol use No    Review of Systems  Constitutional: Negative for chills, diaphoresis and fever.  HENT: Positive for sneezing and sore throat. Negative for congestion, ear discharge, ear pain, facial swelling, postnasal drip, rhinorrhea, sinus pain, sinus pressure and trouble swallowing.   Eyes: Negative for pain, discharge, redness and itching.  Respiratory: Positive for cough, chest tightness, shortness of breath and wheezing.   Cardiovascular: Negative for chest pain and palpitations.  Gastrointestinal: Negative for abdominal pain, diarrhea, nausea and vomiting.  Musculoskeletal: Positive for back pain and myalgias. Negative for arthralgias and joint swelling.    Allergies  Caffeine and Ibuprofen  Home Medications   Prior to Admission  medications   Medication Sig Start Date End Date Taking? Authorizing Provider  cetirizine (ZYRTEC) 10 MG tablet Take 1 tablet (10 mg total) by mouth daily as needed for allergies. 09/25/16  Yes Funches, Gerilyn Nestle, MD  albuterol (PROVENTIL HFA;VENTOLIN HFA) 108 (90 Base) MCG/ACT inhaler Inhale 2 puffs into the lungs every 6 (six) hours as needed for wheezing or shortness of breath. 05/27/17   Cathie Hoops, Lennyx Verdell V, PA-C  naproxen (NAPROSYN) 500 MG tablet Take 1 tablet (500 mg total) by mouth 2 (two) times daily. 05/27/17 06/06/17  Cathie Hoops, Dezra Mandella V, PA-C  predniSONE (STERAPRED UNI-PAK 21 TAB) 10 MG (21) TBPK tablet Take by mouth daily. Take 6 tabs for 1 day, then 5 tabs , then 4 tabs, then 3 tabs, then 2 tabs, then 1 tab 05/27/17   Cathie Hoops, Gissella Niblack V, PA-C   Meds Ordered and Administered this Visit  Medications - No data to display  BP 114/80   Pulse 65   Temp 98.2 F (36.8 C) (Oral)   Resp 16   SpO2 98%  No data found.   Physical Exam  Constitutional: He is oriented to person, place, and time. He appears well-developed and well-nourished. No distress.  HENT:  Head: Normocephalic and atraumatic.  Right Ear: Tympanic membrane, external ear and ear canal normal. Tympanic membrane is not erythematous and not bulging.  Left Ear: Tympanic membrane, external ear and ear canal normal. Tympanic membrane is not erythematous and not bulging.  Nose: Nose normal. Right sinus exhibits no maxillary sinus tenderness and no frontal sinus tenderness. Left sinus exhibits no maxillary sinus tenderness and  no frontal sinus tenderness.  Mouth/Throat: Uvula is midline, oropharynx is clear and moist and mucous membranes are normal.  Eyes: Conjunctivae are normal. Pupils are equal, round, and reactive to light.  Cardiovascular: Normal rate and regular rhythm.  Exam reveals no gallop and no friction rub.   No murmur heard. Pulmonary/Chest: He has wheezes in the left upper field. He has no rhonchi. He has no rales.  Musculoskeletal:  Tenderness  on palpation of the left upper back. Full ROM of shoulders and back. Pain on some movement. No bony tenderness.   Lymphadenopathy:    He has no cervical adenopathy.  Neurological: He is alert and oriented to person, place, and time.  Skin: Skin is warm and dry.  Psychiatric: He has a normal mood and affect. His behavior is normal. Judgment normal.    Urgent Care Course     Procedures (including critical care time)  Labs Review Labs Reviewed - No data to display  Imaging Review No results found.       MDM   1. Acute nasopharyngitis   2. Exacerbation of asthma, unspecified asthma severity, unspecified whether persistent   3. Muscle strain of left upper back, initial encounter    1. Discussed with patient asthma exacerbation most likely due to viral URI. Patient with mild wheezing at the left upper field today, with O2 sat of 98%. Patient to continue using albuterol inhaler. Start Prednisone dose pack as directed. Patient to go to the ED if worsening of symptoms, trouble breathing, chest tightness. Patient to follow up with PCP after URI symptoms subsides if still requiring to use albuterol as often. 2. Discussed with patient history and exam most consistent with muscle strain of the back. Patient has taken ibuprofen and diclofenac in the past with no problems despite ibuprofen listed as allergies. Confirmed with patient he has used NSAIDs with no problems. Start Naproxen 500mg  BID x 10 days.    Belinda FisherYu, Jasime Westergren V, PA-C 05/27/17 1932

## 2017-05-27 NOTE — ED Triage Notes (Signed)
C/O wheezing and cough x 1 wk.  Has been using albuterol inhaler.  Denies fevers,

## 2017-08-25 ENCOUNTER — Other Ambulatory Visit: Payer: Self-pay | Admitting: Family Medicine

## 2017-08-25 ENCOUNTER — Encounter (HOSPITAL_COMMUNITY): Payer: Self-pay | Admitting: Family Medicine

## 2017-08-25 ENCOUNTER — Ambulatory Visit (HOSPITAL_COMMUNITY)
Admission: EM | Admit: 2017-08-25 | Discharge: 2017-08-25 | Disposition: A | Discharge status: Home / Self Care | Payer: BLUE CROSS/BLUE SHIELD | Attending: Family | Admitting: Family

## 2017-08-25 DIAGNOSIS — J309 Allergic rhinitis, unspecified: Secondary | ICD-10-CM

## 2017-08-25 DIAGNOSIS — M722 Plantar fascial fibromatosis: Secondary | ICD-10-CM

## 2017-08-25 MED ORDER — CETIRIZINE HCL 5 MG PO TABS: 5.0000 mg | ORAL_TABLET | Freq: Every day | ORAL | 2 refills | Status: DC

## 2017-08-25 MED ORDER — MELOXICAM 7.5 MG PO TABS: 7.5000 mg | ORAL_TABLET | Freq: Every day | ORAL | 0 refills | Status: DC

## 2017-08-25 MED ORDER — FLUTICASONE PROPIONATE 50 MCG/ACT NA SUSP: 2.0000 | Freq: Every day | NASAL | 3 refills | Status: DC

## 2017-08-25 NOTE — ED Provider Notes (Signed)
[facility]    CSN: [id number] Arrival date & time: 08/25/17  1736     History   Chief Complaint Chief Complaint  Patient presents with  . Allergies  . Foot Pain    HPI Ernest Griffin is a 41 y.o. male.   Chief complaint of left foot pain over heel, 6 days, comes and goes.  Works at at [employer] and on [employer] . Worse after standing. Told years ago that he had a "flatfoot" she'll call some pain. No injury. Also notes he feels a similar pain in his right foot after long periods of standing. Worse in the morning when he first puts his feet on the ground. Hasn't tried any medication.  Patient also would like refill of medication for his allergies. Gets mild ha, dry throat, Clear nasal congestion with allergies which is typical for him.   Would like zyrtec, flonase refilled. No cough, wheezing, fever, sore throat, ear pain.   H/o asthma      no ckd    Past Medical History:  Diagnosis Date  . Asthma     Patient Active Problem List   Diagnosis Date Noted  . Decreased vision of right eye 09/07/2016  . Allergic conjunctivitis of both eyes 09/07/2016  . Hoarseness 04/24/2016  . Borborygmi 04/24/2016  . Rhinitis, allergic 04/24/2016  . Abdominal pain 01/17/2016  . Left knee pain 01/17/2016  . Asthma 01/17/2016  . Joint pain in fingers of right hand 01/17/2016    History reviewed. No pertinent surgical history.     Home Medications    Prior to Admission medications   Medication Sig Start Date End Date Taking? Authorizing Provider  albuterol (PROVENTIL HFA;VENTOLIN HFA) 108 (90 Base) MCG/ACT inhaler Inhale 2 puffs into the lungs every 6 (six) hours as needed for wheezing or shortness of breath. 05/27/17   Cathie Hoops, Amy V, PA-C  cetirizine (ZYRTEC) 5 MG tablet Take 1 tablet (5 mg total) by mouth daily. 08/25/17   Allegra Grana, FNP  fluticasone (FLONASE) 50 MCG/ACT nasal spray Place 2 sprays into both nostrils daily. 08/25/17   Allegra Grana, FNP    meloxicam (MOBIC) 7.5 MG tablet Take 1 tablet (7.5 mg total) by mouth daily. Take with food 08/25/17   Allegra Grana, FNP  predniSONE (STERAPRED UNI-PAK 21 TAB) 10 MG (21) TBPK tablet Take by mouth daily. Take 6 tabs for 1 day, then 5 tabs , then 4 tabs, then 3 tabs, then 2 tabs, then 1 tab 05/27/17   Belinda Fisher, PA-C    Family History Family History  Problem Relation Age of Onset  . Family history unknown: Yes    Social History Social History  Substance Use Topics  . Smoking status: Never Smoker  . Smokeless tobacco: Never Used  . Alcohol use No     Allergies   Caffeine and Ibuprofen   Review of Systems Review of Systems  Constitutional: Negative for chills and fever.  HENT: Positive for rhinorrhea and sore throat. Negative for congestion and sinus pressure.   Respiratory: Negative for cough, shortness of breath and wheezing.   Cardiovascular: Negative for chest pain and palpitations.  Gastrointestinal: Negative for nausea and vomiting.  Musculoskeletal: Negative for joint swelling.  Neurological: Positive for headaches. Negative for dizziness.     Physical Exam Triage Vital Signs ED Triage Vitals [08/25/17 1815]  Enc Vitals Group     BP 104/62     Pulse Rate 88     Resp  18     Temp 98.7 F (37.1 C)     Temp src      SpO2 100 %     Weight      Height      Head Circumference      Peak Flow      Pain Score      Pain Loc      Pain Edu?      Excl. in GC?    No data found.   Updated Vital Signs BP 104/62   Pulse 88   Temp 98.7 F (37.1 C)   Resp 18   SpO2 100%   Visual Acuity Right Eye Distance:   Left Eye Distance:   Bilateral Distance:    Right Eye Near:   Left Eye Near:    Bilateral Near:     Physical Exam  Constitutional: Vital signs are normal. He appears well-developed and well-nourished.  HENT:  Head: Normocephalic and atraumatic.  Right Ear: Hearing, tympanic membrane, external ear and ear canal normal. No drainage, swelling or  tenderness. Tympanic membrane is not injected, not erythematous and not bulging. No middle ear effusion. No decreased hearing is noted.  Left Ear: Hearing, tympanic membrane, external ear and ear canal normal. No drainage, swelling or tenderness. Tympanic membrane is not injected, not erythematous and not bulging.  No middle ear effusion. No decreased hearing is noted.  Nose: Nose normal. Right sinus exhibits no maxillary sinus tenderness and no frontal sinus tenderness. Left sinus exhibits no maxillary sinus tenderness and no frontal sinus tenderness.  Mouth/Throat: Uvula is midline, oropharynx is clear and moist and mucous membranes are normal. No oropharyngeal exudate, posterior oropharyngeal edema, posterior oropharyngeal erythema or tonsillar abscesses.  Eyes: Conjunctivae are normal.  Cardiovascular: Regular rhythm and normal heart sounds.   No calf swelling.   Pulmonary/Chest: Effort normal and breath sounds normal. No respiratory distress. He has no wheezes. He has no rhonchi. He has no rales.  Musculoskeletal:       Right foot: There is normal range of motion, no tenderness, no bony tenderness, no swelling and no laceration.       Left foot: There is tenderness. There is normal range of motion, no bony tenderness and no laceration.  Pain with dorsiflexion of left foot.   No tenderness, erythema, swelling in ankle/ metatarsals bilaterally.   Lymphadenopathy:       Head (right side): No submental, no submandibular, no tonsillar, no preauricular, no posterior auricular and no occipital adenopathy present.       Head (left side): No submental, no submandibular, no tonsillar, no preauricular, no posterior auricular and no occipital adenopathy present.    He has no cervical adenopathy.  Neurological: He is alert.  Skin: Skin is warm and dry.  Psychiatric: He has a normal mood and affect. His speech is normal and behavior is normal.  Vitals reviewed.    UC Treatments / Results  Labs (all  labs ordered are listed, but only abnormal results are displayed) Labs Reviewed - No data to display  EKG  EKG Interpretation None       Radiology No results found.  Procedures Procedures (including critical care time)  Medications Ordered in UC Medications - No data to display   Initial Impression / Assessment and Plan / UC Course  I have reviewed the triage vital signs and the nursing notes.  Pertinent labs & imaging results that were available during my care of the patient were reviewed by me  and considered in my medical decision making (see chart for details).       Final Clinical Impressions(s) / UC Diagnoses   Final diagnoses:  Plantar fasciitis  Allergic rhinitis, unspecified seasonality, unspecified trigger  Symptoms most consistent with plantar fasciitis. Will treat conservatively at this time. Given patient a month trial of meloxicam and advised to follow-up with his PCP for ongoing refills. Information given regarding podiatry; also encouraged ice, gentle stretching. Symptoms also consistent with allergic rhinitis. Agreed to refill patient's allergies medications for 3 months and advised to follow-up with his PCP for further, ongoing refills. Return precautions given.  New Prescriptions New Prescriptions   CETIRIZINE (ZYRTEC) 5 MG TABLET    Take 1 tablet (5 mg total) by mouth daily.   FLUTICASONE (FLONASE) 50 MCG/ACT NASAL SPRAY    Place 2 sprays into both nostrils daily.   MELOXICAM (MOBIC) 7.5 MG TABLET    Take 1 tablet (7.5 mg total) by mouth daily. Take with food     Controlled Substance Prescriptions [place] Controlled Substance Registry consulted? Not Applicable   Allegra Grana, FNP 08/25/17 1943

## 2017-08-25 NOTE — ED Triage Notes (Signed)
Pt sts that he went to the pharmacy to get his allergy meds and they told him he needed a prescription. sts that also the bottom of left foot is painful.

## 2017-08-25 NOTE — Discharge Instructions (Signed)
As discussed, suspect heel pain is related to plantar fasciitis. Ice, gentle stretching I have given you Mobic to trial for the next 3months ; this medication is similar to ibuprofen, Aleve, Motrin etc., you do not take any of these medications when you are on the mobic. Please take with food  if you like this medication please ask your primary to continue refilling it.   Also listed information above for podiatrist if pain is not improved with conservative management.  If there is no improvement in your symptoms, or if there is any worsening of symptoms, or if you have any additional concerns, please return for re-evaluation; or, if we are closed, consider going to the Emergency Room for evaluation if symptoms urgent.

## 2018-01-21 ENCOUNTER — Other Ambulatory Visit: Payer: Self-pay | Admitting: Family

## 2018-01-24 ENCOUNTER — Ambulatory Visit (INDEPENDENT_AMBULATORY_CARE_PROVIDER_SITE_OTHER): Payer: BLUE CROSS/BLUE SHIELD | Admitting: Sports Medicine

## 2018-01-24 DIAGNOSIS — M722 Plantar fascial fibromatosis: Secondary | ICD-10-CM | POA: Diagnosis not present

## 2018-01-24 DIAGNOSIS — M7918 Myalgia, other site: Secondary | ICD-10-CM | POA: Diagnosis not present

## 2018-01-24 MED ORDER — MELOXICAM 15 MG PO TABS: ORAL_TABLET | ORAL | 0 refills | Status: DC

## 2018-01-24 NOTE — Assessment & Plan Note (Signed)
On L. Likely 2/2 overuse at work or strain, though patient with no reported trauma or incident prior to onset of pain. Is not limiting normal daily activities. TTP on exam but no other abnormalities or limitations. Mobic patient will begin taking for plantar fasciitis will likely improve this pain as well. Will f/u when patient returns in 3 wks.

## 2018-01-24 NOTE — Assessment & Plan Note (Signed)
Known diagnosis. Symptoms and physical exam today consistent with that diagnosis as well. Initial improvement with Mobic, though has not taken in many months. As patient responded well, will resume Mobic daily x7d then PRN. Patient also provided with plantar fasciitis exercises to perform daily. Green inserts with scaphoid pads provided today as well for patient's work shoes. F/u in 3 wks for custom orthotics.

## 2018-01-24 NOTE — Progress Notes (Signed)
   Subjective:    Patient ID: Ernest Griffin, male    DOB: 07/26/1976, 42 y.o.   MRN: [id number]  HPI  Ernest Griffin is a 42 yo M presenting for L foot pain and L back pain.   L foot pain Diagnosed with plantar fasciitis in 08/2017 at urgent care facility. Was prescribed Mobic which he said was helpful. Has not taken since he ran out of initial prescription. Was given exercises but did not start these. Pain continues to be worst first thing in the morning, though he does have continued pain throughout the day. 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 He has been told in the past that he has flat feet, so bought insoles from [employer] that he wears in his work shoes. He does not think these are particularly effective. Is currently not taking medication for foot pain.   L back pain Began 3 wks ago. Had similar episode of pain in 05/2017 that resolved quickly with OTC meds. Cannot identify any events or trauma occurring before onset of pain. Has not taken any meds to help with symptoms. Used ice but said this did not help. Has not used heat. Says pain is worse when he has to make fast movements while working on the assembly line. Thinks pain is getting worse. Does not radiate. Describes pain as soreness. He is able to complete his normal daily activities. Is able to sleep at night without difficulty.   Review of Systems MSK: Denies numbness, tingling, radiating pain, sharp pains. Denies weakness.     Objective:   Physical Exam  Constitutional: He is oriented to person, place, and time. He appears well-developed and well-nourished.  HENT:  Head: Normocephalic and atraumatic.  Musculoskeletal:  TTP over L rhomboid. Full ROM of upper extremities bilaterally. 5/5 strength upper extremities bilaterally.   L foot: Tenderness to deep palpation over calcaneus. Full active and passive ROM. Loss of transverse arch.   Neurological: He is alert and oriented to person, place, and time.    Psychiatric: He has a normal mood and affect. His behavior is normal.      Assessment & Plan:  Plantar fasciitis of left foot Known diagnosis. Symptoms and physical exam today consistent with that diagnosis as well. Initial improvement with Mobic, though has not taken in many months. As patient responded well, will resume Mobic daily x7d then PRN. Patient also provided with plantar fasciitis exercises to perform daily. Green inserts with scaphoid pads provided today as well for patient's work shoes. F/u in 3 wks for custom orthotics.   Rhomboid muscle pain On L. Likely 2/2 overuse at work or strain, though patient with no reported trauma or incident prior to onset of pain. Is not limiting normal daily activities. TTP on exam but no other abnormalities or limitations. Mobic patient will begin taking for plantar fasciitis will likely improve this pain as well. Will f/u when patient returns in 3 wks.   Tarri AbernethyAbigail J Tiondra Fang, MD, MPH PGY-3 [facility] Pager [phone]  Patient seen and evaluated with the resident. I agree with the above plan of care. Patient will follow-up in 3 weeks for custom orthotics.

## 2018-01-25 ENCOUNTER — Encounter: Payer: Self-pay | Admitting: Sports Medicine

## 2018-02-18 ENCOUNTER — Encounter: Payer: BLUE CROSS/BLUE SHIELD | Admitting: Sports Medicine

## 2018-02-25 ENCOUNTER — Ambulatory Visit (INDEPENDENT_AMBULATORY_CARE_PROVIDER_SITE_OTHER): Payer: BLUE CROSS/BLUE SHIELD | Admitting: Sports Medicine

## 2018-02-25 VITALS — Ht 67.0 in | Wt 178.0 lb

## 2018-02-25 DIAGNOSIS — M25531 Pain in right wrist: Secondary | ICD-10-CM | POA: Diagnosis not present

## 2018-02-25 DIAGNOSIS — M722 Plantar fascial fibromatosis: Secondary | ICD-10-CM

## 2018-02-25 NOTE — Progress Notes (Signed)
Today's interpreter was Virgel BouquetJameel Ali.

## 2018-02-25 NOTE — Progress Notes (Signed)
   Subjective:    Patient ID: Ernest Griffin, male    DOB: 05/04/1976, 42 y.o.   MRN: [id number]  HPI   Patient comes in today to discuss custom orthotics. The green sports insole and scaphoid pad was uncomfortable. He has stopped using them. He's still having some pain along the arch of the foot. He would also like a note for his employer. I had previously treated him for a right wrist condition. MRI was unremarkable. Patient's pain has resolved. He would like a note stating that he is able to return to work without restriction. Interpreter was present for the entire visit.    Review of Systems As above    Objective:   Physical Exam  Well-developed, well-nourished. No acute distress. Awake alert and oriented 3. Vital signs reviewed  Right wrist: Full painless range of motion. No swelling. No effusion. No tenderness to palpation. Good strength. Good pulses.  Left foot: Patient still has tenderness to palpation along the plantar fascia. No soft tissue swelling.      Assessment & Plan:   Resolved right wrist pain Left foot plantar fasciitis  Patient is given a note to give to his employer releasing him back to full duty without restrictions. For his left foot plantar fasciitis, since he did not like the temporary insert, we will instead try a simple arch strap.If this works, we may not need to make him custom orthotics. He will follow-up for ongoing or recalcitrant issues.

## 2018-04-11 ENCOUNTER — Ambulatory Visit (HOSPITAL_COMMUNITY)
Admission: EM | Admit: 2018-04-11 | Discharge: 2018-04-11 | Disposition: A | Discharge status: Home / Self Care | Payer: BLUE CROSS/BLUE SHIELD | Attending: Family Medicine | Admitting: Family Medicine

## 2018-04-11 ENCOUNTER — Encounter (HOSPITAL_COMMUNITY): Payer: Self-pay | Admitting: Emergency Medicine

## 2018-04-11 DIAGNOSIS — J3089 Other allergic rhinitis: Secondary | ICD-10-CM

## 2018-04-11 MED ORDER — TRIAMCINOLONE ACETONIDE 55 MCG/ACT NA AERO: 2.0000 | INHALATION_SPRAY | Freq: Every day | NASAL | 12 refills | Status: DC

## 2018-04-11 MED ORDER — IPRATROPIUM BROMIDE 0.06 % NA SOLN: 2.0000 | Freq: Four times a day (QID) | NASAL | 0 refills | Status: DC

## 2018-04-11 MED ORDER — CETIRIZINE-PSEUDOEPHEDRINE ER 5-120 MG PO TB12: 1.0000 | ORAL_TABLET | Freq: Two times a day (BID) | ORAL | 0 refills | Status: DC

## 2018-04-11 NOTE — Discharge Instructions (Signed)
Start Nasacort, atrovent nasal spray, zyrtec-D for nasal congestion/drainage. You can use over the counter nasal saline rinse such as neti pot for nasal congestion. Keep hydrated, your urine should be clear to pale yellow in color. Tylenol/motrin for fever and pain. Monitor for any worsening of symptoms, chest pain, shortness of breath, wheezing, swelling of the throat, follow up for reevaluation.   For sore throat try using a honey-based tea. Use 3 teaspoons of honey with juice squeezed from half lemon. Place shaved pieces of ginger into 1/2-1 cup of water and warm over stove top. Then mix the ingredients and repeat every 4 hours as needed.

## 2018-04-11 NOTE — ED Provider Notes (Signed)
[facility]    CSN: [id number] Arrival date & time: 04/11/18  1857     History   Chief Complaint Chief Complaint  Patient presents with  . Allergies    HPI Ernest Griffin is a 42 y.o. male.   42 year old male comes in for a few day history of allergy symptoms.  States has had rhinorrhea, nasal congestion, sinus pressure, eye watering, sneezing, postnasal drip.  States he took 1 pill of Zyrtec without relief.  Denies fever, chills, night sweats.  Denies chest pain, shortness of breath, wheezing.     Past Medical History:  Diagnosis Date  . Asthma     Patient Active Problem List   Diagnosis Date Noted  . Plantar fasciitis of left foot 01/24/2018  . Rhomboid muscle pain 01/24/2018  . Decreased vision of right eye 09/07/2016  . Allergic conjunctivitis of both eyes 09/07/2016  . Hoarseness 04/24/2016  . Borborygmi 04/24/2016  . Rhinitis, allergic 04/24/2016  . Abdominal pain 01/17/2016  . Left knee pain 01/17/2016  . Asthma 01/17/2016  . Joint pain in fingers of right hand 01/17/2016    History reviewed. No pertinent surgical history.     Home Medications    Prior to Admission medications   Medication Sig Start Date End Date Taking? Authorizing Provider  albuterol (PROVENTIL HFA;VENTOLIN HFA) 108 (90 Base) MCG/ACT inhaler Inhale 2 puffs into the lungs every 6 (six) hours as needed for wheezing or shortness of breath. 05/27/17   Cathie Hoops, Amy V, PA-C  cetirizine-pseudoephedrine (ZYRTEC-D) 5-120 MG tablet Take 1 tablet by mouth 2 (two) times daily. 04/11/18   Cathie Hoops, Amy V, PA-C  fluticasone (FLONASE) 50 MCG/ACT nasal spray Place 2 sprays into both nostrils daily. 08/25/17   Allegra Grana, FNP  ipratropium (ATROVENT) 0.06 % nasal spray Place 2 sprays into both nostrils 4 (four) times daily. 04/11/18   Cathie Hoops, Amy V, PA-C  meloxicam (MOBIC) 15 MG tablet Take 1 tablet daily with food for 7 days. Then take as needed. Patient not taking: Reported on 02/25/2018 01/24/18   Ralene Cork, DO  meloxicam (MOBIC) 7.5 MG tablet Take 1 tablet (7.5 mg total) by mouth daily. Take with food Patient not taking: Reported on 02/25/2018 08/25/17   Allegra Grana, FNP  triamcinolone (NASACORT) 55 MCG/ACT AERO nasal inhaler Place 2 sprays into the nose daily. 04/11/18   Belinda Fisher, PA-C    Family History Family History  Family history unknown: Yes    Social History Social History   Tobacco Use  . Smoking status: Never Smoker  . Smokeless tobacco: Never Used  Substance Use Topics  . Alcohol use: No    Alcohol/week: 0.0 oz  . Drug use: No     Allergies   Caffeine and Ibuprofen   Review of Systems Review of Systems  Reason unable to perform ROS: See HPI as above.     Physical Exam Triage Vital Signs ED Triage Vitals [04/11/18 1927]  Enc Vitals Group     BP 111/74     Pulse Rate 61     Resp 18     Temp 98.4 F (36.9 C)     Temp src      SpO2 100 %     Weight      Height      Head Circumference      Peak Flow      Pain Score      Pain Loc      Pain  Edu?      Excl. in GC?    No data found.  Updated Vital Signs BP 111/74   Pulse 61   Temp 98.4 F (36.9 C)   Resp 18   SpO2 100%   Physical Exam  Constitutional: He is oriented to person, place, and time. He appears well-developed and well-nourished. No distress.  HENT:  Head: Normocephalic and atraumatic.  Right Ear: Tympanic membrane, external ear and ear canal normal. Tympanic membrane is not erythematous and not bulging.  Left Ear: Tympanic membrane, external ear and ear canal normal. Tympanic membrane is not erythematous and not bulging.  Nose: Mucosal edema and rhinorrhea present. Right sinus exhibits maxillary sinus tenderness and frontal sinus tenderness. Left sinus exhibits maxillary sinus tenderness and frontal sinus tenderness.  Mouth/Throat: Uvula is midline, oropharynx is clear and moist and mucous membranes are normal.  Eyes: Pupils are equal, round, and reactive to light.  Conjunctivae are normal.  Neck: Normal range of motion. Neck supple.  Cardiovascular: Normal rate, regular rhythm and normal heart sounds. Exam reveals no gallop and no friction rub.  No murmur heard. Pulmonary/Chest: Effort normal and breath sounds normal. He has no decreased breath sounds. He has no wheezes. He has no rhonchi. He has no rales.  Lymphadenopathy:    He has no cervical adenopathy.  Neurological: He is alert and oriented to person, place, and time.  Skin: Skin is warm and dry.  Psychiatric: He has a normal mood and affect. His behavior is normal. Judgment normal.     UC Treatments / Results  Labs (all labs ordered are listed, but only abnormal results are displayed) Labs Reviewed - No data to display  EKG None  Radiology No results found.  Procedures Procedures (including critical care time)  Medications Ordered in UC Medications - No data to display  Initial Impression / Assessment and Plan / UC Course  I have reviewed the triage vital signs and the nursing notes.  Pertinent labs & imaging results that were available during my care of the patient were reviewed by me and considered in my medical decision making (see chart for details).    Discussed with patient history and exam most consistent with allergic rhinitis vs viral illness. Symptomatic treatment as needed. Push fluids. Return precautions given.   Final Clinical Impressions(s) / UC Diagnoses   Final diagnoses:  Non-seasonal allergic rhinitis, unspecified trigger    ED Prescriptions    Medication Sig Dispense Auth. Provider   cetirizine-pseudoephedrine (ZYRTEC-D) 5-120 MG tablet Take 1 tablet by mouth 2 (two) times daily. 30 tablet Yu, Amy V, PA-C   ipratropium (ATROVENT) 0.06 % nasal spray Place 2 sprays into both nostrils 4 (four) times daily. 15 mL Yu, Amy V, PA-C   triamcinolone (NASACORT) 55 MCG/ACT AERO nasal inhaler Place 2 sprays into the nose daily. 1 Inhaler Threasa Alpha, [place] 04/11/18 2109

## 2018-04-11 NOTE — ED Triage Notes (Signed)
Pt c/o bad allergies, headache, facial pain, eye itching, states hes taking zyrtec without relief.

## 2018-08-30 ENCOUNTER — Encounter: Payer: Self-pay | Admitting: Family Medicine

## 2018-08-30 ENCOUNTER — Ambulatory Visit: Payer: BLUE CROSS/BLUE SHIELD | Attending: Family Medicine | Admitting: Family Medicine

## 2018-08-30 VITALS — BP 121/76 | HR 81 | Temp 98.3°F | Resp 16 | Ht 70.0 in | Wt 172.4 lb

## 2018-08-30 DIAGNOSIS — J309 Allergic rhinitis, unspecified: Secondary | ICD-10-CM | POA: Diagnosis not present

## 2018-08-30 DIAGNOSIS — Z2821 Immunization not carried out because of patient refusal: Secondary | ICD-10-CM | POA: Insufficient documentation

## 2018-08-30 DIAGNOSIS — R5383 Other fatigue: Secondary | ICD-10-CM | POA: Diagnosis not present

## 2018-08-30 DIAGNOSIS — J4520 Mild intermittent asthma, uncomplicated: Secondary | ICD-10-CM | POA: Diagnosis not present

## 2018-08-30 DIAGNOSIS — H1010 Acute atopic conjunctivitis, unspecified eye: Secondary | ICD-10-CM

## 2018-08-30 DIAGNOSIS — Z886 Allergy status to analgesic agent status: Secondary | ICD-10-CM | POA: Insufficient documentation

## 2018-08-30 MED ORDER — ALBUTEROL SULFATE HFA 108 (90 BASE) MCG/ACT IN AERS: 2.0000 | INHALATION_SPRAY | Freq: Four times a day (QID) | RESPIRATORY_TRACT | 3 refills | Status: DC | PRN

## 2018-08-30 MED ORDER — FEXOFENADINE-PSEUDOEPHED ER 60-120 MG PO TB12: 1.0000 | ORAL_TABLET | Freq: Two times a day (BID) | ORAL | 4 refills | Status: DC

## 2018-08-30 MED ORDER — MONTELUKAST SODIUM 10 MG PO TABS: 10.0000 mg | ORAL_TABLET | Freq: Every day | ORAL | 3 refills | Status: DC

## 2018-08-30 NOTE — Progress Notes (Signed)
Subjective:    Patient ID: Ernest Griffin, male    DOB: June 28, 1976, 42 y.o.   MRN: [id number]  HPI 42 yo male last seen here in this office on 09/17/16 due to allergic rhinitis symptoms who returns with continued complaint of daily nasal congestion, episodes of recurrent sneezing, watery/itchy eyes as well as itchy ears.  Patient states that recently, he has also blown some blood out of his nose.  Patient states that he needs paperwork, FMLA, filled out for his workplace.  Patient states that he tends to miss one whole day from work one day per week.  Patient was asked several times if he is missing work because of the allergy symptoms or because of the medications for his allergy symptoms as patient states that he usually misses work because he is drowsy.  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  Patient also states that at times he has recurrent sneezing up to 10 times in a row which also causes him to be unable to perform his job duties.  Patient states that he is currently taking Zyrtec before he goes to work.      Patient reports that his asthma has been fairly well controlled.  No nighttime awakening with shortness of breath or wheezing.  Patient does feel shortness of breath at night due to being unable to breathe through his nose.  Patient states that his asthma is usually well controlled except for when he has severe exposure to allergens.  Patient reports allergies to grass, dust, pollen.  Patient states that he would like testing to try and find out what he is allergic to so that his allergies can be better treated.  Patient with complaint of frontal headache secondary to allergies as well as throat irritation from postnasal drainage.  Patient denies any recent fever or chills.  Patient does have occasional nonproductive cough secondary to allergic rhinitis. Past Medical History:  Diagnosis Date  . Asthma   Past surgical  history: Patient has had no surgeries Family History: Patient denies any family medical illnesses.  Patient denies any family history of diabetes, no hypertension, no cancers, no heart disease and no stroke. Social History   Tobacco Use  . Smoking status: Never Smoker  . Smokeless tobacco: Never Used  Substance Use Topics  . Alcohol use: No    Alcohol/week: 0.0 standard drinks  . Drug use: No   Allergies  Allergen Reactions  . Caffeine   . Ibuprofen     Pruritis      Review of Systems  Constitutional: Positive for chills and fatigue (occasional). Negative for fever.  HENT: Positive for congestion, nosebleeds, postnasal drip, rhinorrhea, sinus pressure, sinus pain and sneezing. Negative for ear pain, sore throat, trouble swallowing and voice change.   Respiratory: Positive for cough (Occasional dry cough from postnasal drainage). Negative for shortness of breath.   Cardiovascular: Negative for chest pain, palpitations and leg swelling.  Gastrointestinal: Negative for abdominal pain and nausea.  Endocrine: Negative for polydipsia, polyphagia and polyuria.  Genitourinary: Negative for dysuria and frequency.  Musculoskeletal: Negative for arthralgias, back pain and joint swelling.  Neurological: Positive for headaches. Negative for dizziness, weakness, light-headedness and numbness.       Objective:   Physical Exam BP 121/76   Pulse 81   Temp 98.3 F (36.8 C) (Oral)   Resp 16   Ht 5\' 10"  (1.778 m)   Wt 172  lb 6.4 oz (78.2 kg)   SpO2 100%   BMI 24.74 kg/m Vital signs and nurse's notes reviewed EENT- normal conjunctiva, extraocular movements intact, TMs dull bilaterally, patient with moderate edema of the nasal turbinates, nasal mucosa is pink, patient with some clear to light white nasal drainage, patient with mild posterior pharynx erythema with cobblestoning Neck-supple, no lymphadenopathy, no thyromegaly, no carotid bruit Cardiovascular-regular rate and rhythm, (initial  suspicion for a soft murmur but this was not heard once patient was asked to hold his breath) Lungs-clear to auscultation bilaterally with mild decreased breath sounds at the lung bases Abdomen-soft, nontender Back-no CVA tenderness Extremities-no edema      Assessment & Plan:  1. Mild intermittent asthma without complication Patient reports mild intermittent asthma without complications.  Patient is provided with refill of his albuterol inhaler.  We will add Singulair to help with both asthma and allergic rhinitis.  Patient is being referred to allergist for further evaluation of his allergic rhinitis.  Patient declined influenza immunization. - Ambulatory referral to Allergy - albuterol (PROVENTIL HFA;VENTOLIN HFA) 108 (90 Base) MCG/ACT inhaler; Inhale 2 puffs into the lungs every 6 (six) hours as needed for wheezing or shortness of breath.  Dispense: 1 Inhaler; Refill: 3 - montelukast (SINGULAIR) 10 MG tablet; Take 1 tablet (10 mg total) by mouth at bedtime.  Dispense: 30 tablet; Refill: 3  2. Allergic rhinoconjunctivitis Patient with complaint of recurrent nasal congestion as well as sneezing and itchy eyes.  Patient with complaint of Zyrtec causing drowsiness which causes him to miss work at least once per week in addition to his allergy symptoms.  Patient's Zyrtec D will be switched to Allegra-D to see if this helps with patient's drowsiness with use of medications.  Singulair will be added to help with allergy symptoms.  Patient is being referred to an allergist for further evaluation and treatment. - Ambulatory referral to Allergy - fexofenadine-pseudoephedrine (ALLEGRA-D) 60-120 MG 12 hr tablet; Take 1 tablet by mouth 2 (two) times daily. As needed for nasal congestion  Dispense: 60 tablet; Refill: 4 - montelukast (SINGULAIR) 10 MG tablet; Take 1 tablet (10 mg total) by mouth at bedtime.  Dispense: 30 tablet; Refill: 3  3. Fatigue, unspecified type Patient with complaint of fatigue and  poor sleep related to chronic nasal congestion as well as to his medications.  Patient will also have CMP and CBC to look for other causes of fatigue such as electrolyte or liver disorder or anemia/blood disorder. - Comprehensive metabolic panel - CBC with Differential  4. FMLA paperwork completion Patient brought FMLA paperwork to today's visit.  Paperwork was completed as per patient's indication that he misses at least one day of work per week secondary to either allergy symptoms or drowsiness secondary to his allergy symptoms.  Paperwork was given to nursing staff to copy and have scanned into patient's medical records and to provide copy to the patient.  Nurse returned after giving paperwork to patient stating that patient wished to be written out of work until his allergy symptoms improved.  Patient had been asked while in the exam room if he had to work today and patient replied that he did not have to work today.  Patient was given a work note for today since he was here at his visit but patient is not being written out long-term.  *Influenza immunization was offered at today's visit which patient declined.  An After Visit Summary was printed and given to the patient.  Return in  about 6 weeks (around 10/11/2018) for asthma/allergies.

## 2018-08-31 LAB — COMPREHENSIVE METABOLIC PANEL WITH GFR
ALT: 28 IU/L (ref 0–44)
AST: 28 IU/L (ref 0–40)
Albumin/Globulin Ratio: 1.5 (ref 1.2–2.2)
Albumin: 4.1 g/dL (ref 3.5–5.5)
Alkaline Phosphatase: 57 IU/L (ref 39–117)
BUN/Creatinine Ratio: 16 (ref 9–20)
BUN: 15 mg/dL (ref 6–24)
Bilirubin Total: 0.3 mg/dL (ref 0.0–1.2)
CO2: 23 mmol/L (ref 20–29)
Calcium: 9.2 mg/dL (ref 8.7–10.2)
Chloride: 103 mmol/L (ref 96–106)
Creatinine, Ser: 0.92 mg/dL (ref 0.76–1.27)
GFR calc Af Amer: 117 mL/min/1.73
GFR calc non Af Amer: 101 mL/min/1.73
Globulin, Total: 2.7 g/dL (ref 1.5–4.5)
Glucose: 84 mg/dL (ref 65–99)
Potassium: 4.1 mmol/L (ref 3.5–5.2)
Sodium: 143 mmol/L (ref 134–144)
Total Protein: 6.8 g/dL (ref 6.0–8.5)

## 2018-08-31 LAB — CBC WITH DIFFERENTIAL/PLATELET
Basophils Absolute: 0.1 x10E3/uL (ref 0.0–0.2)
Basos: 1 %
EOS (ABSOLUTE): 0.8 x10E3/uL — ABNORMAL HIGH (ref 0.0–0.4)
Eos: 16 %
Hematocrit: 40.3 % (ref 37.5–51.0)
Hemoglobin: 13.3 g/dL (ref 13.0–17.7)
Immature Grans (Abs): 0 x10E3/uL (ref 0.0–0.1)
Immature Granulocytes: 0 %
Lymphocytes Absolute: 1.3 x10E3/uL (ref 0.7–3.1)
Lymphs: 29 %
MCH: 29 pg (ref 26.6–33.0)
MCHC: 33 g/dL (ref 31.5–35.7)
MCV: 88 fL (ref 79–97)
Monocytes Absolute: 0.6 x10E3/uL (ref 0.1–0.9)
Monocytes: 12 %
Neutrophils Absolute: 1.9 x10E3/uL (ref 1.4–7.0)
Neutrophils: 42 %
Platelets: 223 x10E3/uL (ref 150–450)
RBC: 4.59 x10E6/uL (ref 4.14–5.80)
RDW: 12.5 % (ref 12.3–15.4)
WBC: 4.6 x10E3/uL (ref 3.4–10.8)

## 2018-09-03 ENCOUNTER — Telehealth: Payer: Self-pay

## 2018-09-03 NOTE — Telephone Encounter (Signed)
-----   Message from Cain Saupe, MD sent at 09/02/2018  9:37 PM EDT ----- Notify patient of normal CMP and normal CBC

## 2018-09-03 NOTE — Telephone Encounter (Signed)
Patient was called, no answer. If patient returns call please inform patient that his labs were normal.

## 2018-09-05 ENCOUNTER — Encounter: Payer: Self-pay | Admitting: Sports Medicine

## 2018-09-05 ENCOUNTER — Ambulatory Visit (INDEPENDENT_AMBULATORY_CARE_PROVIDER_SITE_OTHER): Payer: BLUE CROSS/BLUE SHIELD | Admitting: Sports Medicine

## 2018-09-05 VITALS — BP 106/70 | Ht 69.69 in | Wt 173.0 lb

## 2018-09-05 DIAGNOSIS — M545 Low back pain, unspecified: Secondary | ICD-10-CM

## 2018-09-05 NOTE — Progress Notes (Signed)
  Ernest Griffin - 42 y.o. male MRN [id number]  Date of birth: [birth year]    SUBJECTIVE:      Chief Complaint:/ HPI:  42 year old male with 2 weeks of 5/10 low back pain.  Pain is fairly centralized in the L3-L5 region.  He denies any specific injury.  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  He notes some symptoms occasionally into the posterior thigh bilaterally but this is inconsistent.  Symptoms worse with forward flexion.  Relieved with rest.  He has not tried any stretching, NSAIDs, Tylenol, heat/ice.  He denies any red flag symptoms.   ROS:     See HPI  PERTINENT  PMH / PSH FH / / SH:  Past Medical, Surgical, Social, and Family History Reviewed & Updated in the EMR.   OBJECTIVE: BP 106/70   Ht 5' 9.69" (1.77 m)   Wt 173 lb (78.5 kg)   BMI 25.05 kg/m   Physical Exam:  Vital signs are reviewed.  GEN: Alert and oriented, NAD Pulm: Breathing unlabored PSY: normal mood, congruent affect  MSK: Lumbar spine: - Inspection: no gross deformity or asymmetry, swelling or ecchymosis - Palpation: Very mild tenderness over the L3-L5 region of his lumbar spine.  No focal bony tenderness - ROM: full ROM - Strength: 5/5 strength of lower extremity in L4-S1 nerve root distributions b/l - Neuro: sensation intact in the L4-S1 nerve root distribution b/l, 2+ L4 and S1 reflexes - Special testing: Negative straight leg raise   ASSESSMENT & PLAN:  1.  Low back pain secondary to muscle strain.  No concerning radicular symptoms.  No red flag symptoms. -Patient provided with home exercises for lumbar strengthening. -NSAIDs as needed -Heat/ice -Follow-up in 6 weeks if symptoms persist.

## 2018-09-05 NOTE — Patient Instructions (Signed)
Today we saw you for your low back pain. I believe this is most likely due to posture and weakening of some muscles in the back. We will have you do home exercises to help strengthen your core.  Do these exercises at least once per day 5 days/week.  Continue to do them for 6 weeks If you are not feeling any improvement after 6 weeks please return to the sports medicine clinic for follow-up. In addition you may use heat or ice as needed Ibuprofen 600-800 mg 3 times daily OR Aleve 2 tablets twice daily with food as needed

## 2018-10-14 ENCOUNTER — Ambulatory Visit: Payer: BLUE CROSS/BLUE SHIELD | Admitting: Family Medicine

## 2018-10-21 ENCOUNTER — Encounter: Payer: Self-pay | Admitting: Family Medicine

## 2018-10-21 ENCOUNTER — Ambulatory Visit: Payer: BLUE CROSS/BLUE SHIELD | Attending: Family Medicine | Admitting: Family Medicine

## 2018-10-21 VITALS — BP 117/78 | HR 74 | Temp 98.4°F | Resp 18 | Ht 70.08 in | Wt 170.0 lb

## 2018-10-21 DIAGNOSIS — R040 Epistaxis: Secondary | ICD-10-CM | POA: Diagnosis not present

## 2018-10-21 DIAGNOSIS — J4540 Moderate persistent asthma, uncomplicated: Secondary | ICD-10-CM

## 2018-10-21 DIAGNOSIS — J309 Allergic rhinitis, unspecified: Secondary | ICD-10-CM | POA: Insufficient documentation

## 2018-10-21 DIAGNOSIS — Z23 Encounter for immunization: Secondary | ICD-10-CM

## 2018-10-21 DIAGNOSIS — J310 Chronic rhinitis: Secondary | ICD-10-CM

## 2018-10-21 DIAGNOSIS — H109 Unspecified conjunctivitis: Secondary | ICD-10-CM

## 2018-10-21 DIAGNOSIS — Z7951 Long term (current) use of inhaled steroids: Secondary | ICD-10-CM | POA: Insufficient documentation

## 2018-10-21 MED ORDER — LEVOCETIRIZINE DIHYDROCHLORIDE 5 MG PO TABS: 5.0000 mg | ORAL_TABLET | Freq: Every evening | ORAL | 3 refills | Status: DC

## 2018-10-21 MED ORDER — BUDESONIDE-FORMOTEROL FUMARATE 160-4.5 MCG/ACT IN AERO: 2.0000 | INHALATION_SPRAY | Freq: Two times a day (BID) | RESPIRATORY_TRACT | 3 refills | Status: DC

## 2018-10-21 NOTE — Progress Notes (Signed)
Subjective:    Patient ID: Ernest Griffin, male    DOB: 10/04/1976, 42 y.o.   MRN: [id number]   Due to a language barrier, a video interpreter service was used at today's visit though patient is able to converse somewhat in English  HPI       42 yo male seen in follow-up of mild intermittent asthma and rhinoconjunctivitis.  Patient was referred to an allergist but I do not see a follow-up note.  Patient has been seen at sports medicine regarding low back pain since his last visit.  Patient reported issues with chronic drowsiness from medications for treatment of rhinoconjunctivitis and patient at last visit asked for [facility] paperwork as he stated that he missed work at least once per week secondary to his symptoms.  Patient Zyrtec-D was switched to Allegra-D to see if this would help reduce his drowsiness.  Patient was also placed on Singulair for asthma and allergic rhinitis and given refill of his albuterol.  Prescription was also provided for Nasacort nasal spray.      At today's visit, patient reports that he has had decreased drowsiness since switching from Zyrtec-D to Allegra-D.  Patient however states that for the past 20 days, he has had a nosebleed about every other day.  Patient states that the nosebleeds when they start usually last about 5 minutes and resolve by leaning his head back.  Patient states that since switching from Zyrtec to [facility], patient states that he has recurrent episodes of sneezing and a sneeze up to 8 times in a row.  Patient states that he sometimes has to pull off on the shoulder of the road due to the recurrent sneezing so that he will not be involved in a car accident.  Patient reports that he has not obtained the nasal spray, Nasacort as this was not available in the pharmacy when he got his medications filled.  Patient reports he continues to have itchy eyes and now also with itchy ears and itchy throat.  Patient does have postnasal drainage.  Patient states that his nasal  drainage when he is able to blow something out is usually clear.  Patient denies any fever or chills, no headache or dizziness.      Patient states that he has had daily sensation of shortness of breath, chest tightness and wheezing, which mostly occurs at night.  Patient is taking the Singulair and uses his albuterol inhaler on a daily basis.  Patient denies any productive cough.  Patient believes that he was contacted by the allergist office but patient states that he was out of town at the time and therefore missed the appointment.  Review of Systems  Constitutional: Positive for fatigue. Negative for chills and fever.  HENT: Positive for congestion, nosebleeds, postnasal drip, rhinorrhea, sinus pressure and sneezing. Negative for ear pain, facial swelling, sinus pain, sore throat, tinnitus and trouble swallowing.   Respiratory: Positive for cough, chest tightness, shortness of breath and wheezing.   Cardiovascular: Negative for chest pain, palpitations and leg swelling.  Gastrointestinal: Negative for abdominal pain, constipation, diarrhea and nausea.  Genitourinary: Negative for dysuria and frequency.  Musculoskeletal: Negative for arthralgias and back pain.  Neurological: Negative for dizziness and headaches.       Objective:   Physical Exam BP 117/78 (BP Location: Right Arm, Patient Position: Sitting, Cuff Size: Normal)   Pulse 74   Temp 98.4 F (36.9 C) (Oral)   Resp 18   Ht 5' 10.08" (1.78 m)  Wt 170 lb (77.1 kg)   SpO2 100%   BMI 24.34 kg/m Nurse's notes and vital signs reviewed General-well-nourished, well-developed male in no acute distress but patient with a nasal quality to his voice ENT-TMs dull, nares with edema/erythema of the nasal turbinates with marked narrowing of the right nasal canal secondary to edema of the nasal mucosa, patient with clear nasal discharge, patient with posterior pharynx erythema/edema and cobblestoning.  Patient was slightly narrowed posterior  airway Neck-supple, no lymphadenopathy, no thyromegaly, no carotid bruit.  Patient with large neck size Lungs-clear to auscultation bilaterally Cardiovascular-regular rate and rhythm        Assessment & Plan:  1. Moderate persistent asthma without complication Patient now reports that he is having daily symptoms of shortness of breath, wheezing and chest tightness which occur at night.  Patient will be started on Symbicort 160 mg 2 puffs twice daily.  Discussed with patient the importance of keeping follow-up with allergist and new appointment/referral is being made - Ambulatory referral to Allergy - budesonide-formoterol (SYMBICORT) 160-4.5 MCG/ACT inhaler; Inhale 2 puffs into the lungs 2 (two) times daily.  Dispense: 1 Inhaler; Refill: 3  2. Rhinoconjunctivitis Patient with continued symptoms of rhinoconjunctivitis.  Patient is being referred to an allergist as he missed the appointment after being referred at his last visit here.  Patient will have discontinuation of Allegra-D.  Patient will be placed on Xyzal to take at bedtime and patient may obtain over-the-counter Sudafed to take as needed. - Ambulatory referral to Allergy - levocetirizine (XYZAL) 5 MG tablet; Take 1 tablet (5 mg total) by mouth every evening. To decrease allergy symptoms  Dispense: 30 tablet; Refill: 3  3. Frequent epistaxis Patient did not have any evidence of bleeding and no area of nasal irritation which would cause recurrent bleeding at today's visit.  Patient had been asked to stop the use of nasal spray however patient then stated that nasal spray was not available when he went to pick up medicines after his last visit therefore he has not been using a nasal spray.  Patient was advised to hold pressure to the tip of the nose and leaning forward when having a nosebleed and keep continuous pressure on the area for about 5 minutes before checking to see if the bleeding has stopped.  Patient is also being referred to  the allergist for further evaluation and treatment - Ambulatory referral to Allergy - levocetirizine (XYZAL) 5 MG tablet; Take 1 tablet (5 mg total) by mouth every evening. To decrease allergy symptoms  Dispense: 30 tablet; Refill: 3  4. Need for immunization against influenza Patient was offered influenza immunization at today's visit and patient agreed to have this immunization done.  Patient was given informational handout regarding immunization - Flu Vaccine QUAD 36+ mos IM  An After Visit Summary was printed and given to the patient.  Return in about 4 weeks (around 11/18/2018).

## 2018-11-11 ENCOUNTER — Ambulatory Visit: Payer: BLUE CROSS/BLUE SHIELD | Admitting: Family Medicine

## 2019-03-31 ENCOUNTER — Telehealth: Payer: Self-pay | Admitting: Family Medicine

## 2019-03-31 NOTE — Telephone Encounter (Signed)
Ernest Griffin, can you contact the patient and find out what kind of work he does and what kind of mask his employer is requiring him to wear

## 2019-03-31 NOTE — Telephone Encounter (Signed)
Pt is calling, states he is also having stomach pain, allergies and sometimes a fever

## 2019-03-31 NOTE — Telephone Encounter (Signed)
Patient called stating his employer is requiring him to wear a mask and that it is causing him to have difficulty breathing. Patient states he was told by his employer PCP must write a letter stating what mask patient needs to use. Please follow up.

## 2019-04-01 NOTE — Telephone Encounter (Signed)
Pt came in wanting to pick up letter for employer advised him we needed info about mask per previous note pt stated he needed  9001 mask white

## 2019-04-01 NOTE — Telephone Encounter (Signed)
Please see if you can contact patient, if he is having fever and abdominal pain he should go to urgent care or ED for evaluation.  I can write a note for him regarding the mask but to clarify, if he asking to be allowed to wear a 9001 white mask

## 2019-04-01 NOTE — Telephone Encounter (Signed)
Patient states he works at [employer].

## 2019-04-02 ENCOUNTER — Encounter: Payer: Self-pay | Admitting: Family Medicine

## 2019-04-02 ENCOUNTER — Other Ambulatory Visit: Payer: Self-pay | Admitting: Family Medicine

## 2019-04-02 DIAGNOSIS — H109 Unspecified conjunctivitis: Secondary | ICD-10-CM

## 2019-04-02 DIAGNOSIS — J310 Chronic rhinitis: Secondary | ICD-10-CM

## 2019-04-02 DIAGNOSIS — R040 Epistaxis: Secondary | ICD-10-CM

## 2019-04-02 DIAGNOSIS — J4540 Moderate persistent asthma, uncomplicated: Secondary | ICD-10-CM

## 2019-04-02 DIAGNOSIS — J4520 Mild intermittent asthma, uncomplicated: Secondary | ICD-10-CM

## 2019-04-02 MED ORDER — BUDESONIDE-FORMOTEROL FUMARATE 160-4.5 MCG/ACT IN AERO: 2.0000 | INHALATION_SPRAY | Freq: Two times a day (BID) | RESPIRATORY_TRACT | 1 refills | Status: DC

## 2019-04-02 MED ORDER — ALBUTEROL SULFATE HFA 108 (90 BASE) MCG/ACT IN AERS: 2.0000 | INHALATION_SPRAY | Freq: Four times a day (QID) | RESPIRATORY_TRACT | 1 refills | Status: DC | PRN

## 2019-04-02 MED ORDER — LEVOCETIRIZINE DIHYDROCHLORIDE 5 MG PO TABS: 5.0000 mg | ORAL_TABLET | Freq: Every evening | ORAL | 1 refills | Status: DC

## 2019-04-02 MED ORDER — IPRATROPIUM BROMIDE 0.06 % NA SOLN: 2.0000 | Freq: Four times a day (QID) | NASAL | 0 refills | Status: DC

## 2019-04-02 MED FILL — SYMBICORT 160-4.5 MCG INH: 160-4.5 | 30 days supply | Qty: 10 | Fill #0

## 2019-04-02 MED FILL — ALBUTEROL SULFATE HFA 108 (: 108 (90 BAS | 25 days supply | Qty: 9 | Fill #0

## 2019-04-02 MED FILL — IPRATROPIUM 0.06% SPRAY: 0.06 | 30 days supply | Qty: 15 | Fill #0

## 2019-04-02 NOTE — Telephone Encounter (Signed)
Letter was physically given to patient and he showed and verbalized understanding.

## 2019-04-02 NOTE — Progress Notes (Signed)
Patient ID: Ernest Griffin, male   DOB: 1976/05/04, 43 y.o.   MRN: [id number]   Patient came into office for a letter and requested a letter but also wanted refills of medications for asthma and allergies

## 2019-04-02 NOTE — Telephone Encounter (Signed)
[employer] interpreter and spoke with [facility] who called patient. We were not able to reach patient or leave a message due to patient not having a voicemail.

## 2019-04-02 NOTE — Telephone Encounter (Signed)
Patient walked into office and he stated he would like to have the white mask 9001 note. Per patient the regular mask sets off his allergies and asthma. Per patient he also would like to have refills for his allergy medication and asthma medication. Patient number is [phone].

## 2019-04-02 NOTE — Telephone Encounter (Signed)
Letter has been printed for patient.

## 2019-06-16 ENCOUNTER — Encounter (HOSPITAL_COMMUNITY): Payer: Self-pay

## 2019-06-16 ENCOUNTER — Ambulatory Visit (HOSPITAL_COMMUNITY)
Admission: EM | Admit: 2019-06-16 | Discharge: 2019-06-16 | Disposition: A | Discharge status: Home / Self Care | Payer: BC Managed Care – PPO | Attending: Family Medicine | Admitting: Family Medicine

## 2019-06-16 ENCOUNTER — Other Ambulatory Visit: Payer: Self-pay

## 2019-06-16 DIAGNOSIS — H1010 Acute atopic conjunctivitis, unspecified eye: Secondary | ICD-10-CM

## 2019-06-16 DIAGNOSIS — J4520 Mild intermittent asthma, uncomplicated: Secondary | ICD-10-CM | POA: Diagnosis not present

## 2019-06-16 DIAGNOSIS — J309 Allergic rhinitis, unspecified: Secondary | ICD-10-CM

## 2019-06-16 DIAGNOSIS — M25432 Effusion, left wrist: Secondary | ICD-10-CM

## 2019-06-16 DIAGNOSIS — T7840XA Allergy, unspecified, initial encounter: Secondary | ICD-10-CM

## 2019-06-16 MED ORDER — MONTELUKAST SODIUM 10 MG PO TABS: 10.0000 mg | ORAL_TABLET | Freq: Every day | ORAL | 3 refills | Status: DC

## 2019-06-16 MED ORDER — TRIAMCINOLONE ACETONIDE 55 MCG/ACT NA AERO: 2.0000 | INHALATION_SPRAY | Freq: Every day | NASAL | 12 refills | Status: DC

## 2019-06-16 MED ORDER — PREDNISONE 10 MG PO TABS: 20.0000 mg | ORAL_TABLET | Freq: Every day | ORAL | 0 refills | Status: AC

## 2019-06-16 NOTE — Discharge Instructions (Signed)
Take the Nasacort, 2 sprays in the nostrils daily. Singulair at bedtime You can take the Zyrtec during the day if needed Use your inhalers as needed  Prednisone daily for the next 5 days to treat the left wrist swelling, pain and inflammation. We are giving you a wrist splint here in clinic, you need to wear the splint until better Work note given for today  Follow up as needed for continued or worsening symptoms

## 2019-06-16 NOTE — ED Triage Notes (Signed)
Patient presents to Urgent Care with complaints of asthma flareup since yesterday. Patient reports he also has some left wrist pain and swelling that he noticed after he got off work yesterday.

## 2019-06-17 NOTE — ED Provider Notes (Signed)
[facility]    CSN: [id number] Arrival date & time: 06/16/19  0957     History   Chief Complaint Chief Complaint  Patient presents with   Asthma    HPI Ernest Griffin is a 43 y.o. male.   Patient is a 43 year old male with past medical history of asthma, allergies.  He presents today with acute asthma flare that started yesterday.  Reporting that his allergies have been very severe recently.  He takes Zyrtec and Flonase for his allergies.  Reports that this is not helping.  He has had mild wheezing and shortness of breath.  He has been using his inhaler with some relief.  Denies any associated fever, chills, cough, chest congestion.  No recent sick contacts or recent traveling.  No COVID exposures.  Patient works at [employer].  Patient also reporting left wrist pain, swelling that he noticed after he got off work yesterday.  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  Denies any injuries to the wrist.  The pain and swelling somewhat radiate into the hand.  Denies any associated numbness, tingling, weakness.  Denies any loss of sensation.   ROS per HPI        Past Medical History:  Diagnosis Date   Asthma     Patient Active Problem List   Diagnosis Date Noted   Plantar fasciitis of left foot 01/24/2018   Rhomboid muscle pain 01/24/2018   Decreased vision of right eye 09/07/2016   Allergic conjunctivitis of both eyes 09/07/2016   Hoarseness 04/24/2016   Borborygmi 04/24/2016   Rhinitis, allergic 04/24/2016   Abdominal pain 01/17/2016   Left knee pain 01/17/2016   Asthma 01/17/2016   Joint pain in fingers of right hand 01/17/2016    History reviewed. No pertinent surgical history.     Home Medications    Prior to Admission medications   Medication Sig Start Date End Date Taking? Authorizing Provider  albuterol (VENTOLIN HFA) 108 (90 Base) MCG/ACT inhaler Inhale 2 puffs into the lungs every 6 (six) hours  as needed for wheezing or shortness of breath. Needs office visit 04/02/19   Antony Blackbird, MD  budesonide-formoterol (SYMBICORT) 160-4.5 MCG/ACT inhaler Inhale 2 puffs into the lungs 2 (two) times daily. 04/02/19   Fulp, Cammie, MD  ipratropium (ATROVENT) 0.06 % nasal spray Place 2 sprays into both nostrils 4 (four) times daily. 04/02/19   Fulp, Cammie, MD  montelukast (SINGULAIR) 10 MG tablet Take 1 tablet (10 mg total) by mouth at bedtime. 06/16/19   Loura Halt A, NP  predniSONE (DELTASONE) 10 MG tablet Take 2 tablets (20 mg total) by mouth daily for 5 days. 06/16/19 06/21/19  Loura Halt A, NP  triamcinolone (NASACORT) 55 MCG/ACT AERO nasal inhaler Place 2 sprays into the nose daily. 06/16/19   Loura Halt A, NP  levocetirizine (XYZAL) 5 MG tablet Take 1 tablet (5 mg total) by mouth every evening. To decrease allergy symptoms 04/02/19 06/16/19  Antony Blackbird, MD    Family History Family History  Problem Relation Age of Onset   Healthy Father     Social History Social History   Tobacco Use   Smoking status: Never Smoker   Smokeless tobacco: Never Used  Substance Use Topics   Alcohol use: No    Alcohol/week: 0.0 standard drinks   Drug use: No     Allergies   Caffeine and Ibuprofen   Review of Systems Review of Systems   Physical Exam  Triage Vital Signs ED Triage Vitals  Enc Vitals Group     BP 06/16/19 1111 112/81     Pulse Rate 06/16/19 1111 75     Resp 06/16/19 1111 16     Temp 06/16/19 1111 98.3 F (36.8 C)     Temp Source 06/16/19 1111 Oral     SpO2 06/16/19 1111 100 %     Weight --      Height --      Head Circumference --      Peak Flow --      Pain Score 06/16/19 1110 3     Pain Loc --      Pain Edu? --      Excl. in GC? --    No data found.  Updated Vital Signs BP 112/81 (BP Location: Right Arm)    Pulse 75    Temp 98.3 F (36.8 C) (Oral)    Resp 16    SpO2 100%   Visual Acuity Right Eye Distance:   Left Eye Distance:   Bilateral Distance:     Right Eye Near:   Left Eye Near:    Bilateral Near:     Physical Exam Vitals signs and nursing note reviewed.  Constitutional:      Appearance: Normal appearance.  HENT:     Head: Normocephalic and atraumatic.     Right Ear: Tympanic membrane and ear canal normal.     Left Ear: Tympanic membrane and ear canal normal.     Nose: Nose normal.     Mouth/Throat:     Pharynx: Oropharynx is clear.  Eyes:     Conjunctiva/sclera: Conjunctivae normal.  Neck:     Musculoskeletal: Normal range of motion.  Cardiovascular:     Rate and Rhythm: Normal rate and regular rhythm.     Pulses: Normal pulses.     Heart sounds: Normal heart sounds.  Pulmonary:     Effort: Pulmonary effort is normal.     Breath sounds: Normal breath sounds. No wheezing.  Musculoskeletal: Normal range of motion.        General: Swelling and tenderness present. No deformity or signs of injury.     Comments: Mild swelling to the left wrist and hand.  No bruising, deformities.  No erythema.  Limited range of motion of the wrist due to pain. Strong radial pulse, sensation intact, good cap refill. Temperature normal  Lymphadenopathy:     Cervical: No cervical adenopathy.  Skin:    General: Skin is warm and dry.     Capillary Refill: Capillary refill takes less than 2 seconds.     Findings: No erythema or rash.  Neurological:     Mental Status: He is alert.  Psychiatric:        Mood and Affect: Mood normal.      UC Treatments / Results  Labs (all labs ordered are listed, but only abnormal results are displayed) Labs Reviewed - No data to display  EKG   Radiology No results found.  Procedures Procedures (including critical care time)  Medications Ordered in UC Medications - No data to display  Initial Impression / Assessment and Plan / UC Course  I have reviewed the triage vital signs and the nursing notes.  Pertinent labs & imaging results that were available during my care of the patient were  reviewed by me and considered in my medical decision making (see chart for details).    Mild intermittent asthma without complication.  Treating for  acute asthma flare due to allergies with prednisone daily for 5 days.  Also restarting his Singulair and Nasacort.  He has been taking Zyrtec and Flonase which has not been helping him. Use inhalers as needed for cough, wheezing, shortness of breath.  Also treating for left wrist pain/hand pain and swelling of the left wrist with prednisone daily for 5 days. Most likely tendonitis Wrist splint given here in clinic for stabilization Rest, ice, elevate Work note given Final Clinical Impressions(s) / UC Diagnoses   Final diagnoses:  Mild intermittent asthma without complication  Wrist swelling, left     Discharge Instructions     Take the Nasacort, 2 sprays in the nostrils daily. Singulair at bedtime You can take the Zyrtec during the day if needed Use your inhalers as needed  Prednisone daily for the next 5 days to treat the left wrist swelling, pain and inflammation. We are giving you a wrist splint here in clinic, you need to wear the splint until better Work note given for today  Follow up as needed for continued or worsening symptoms     ED Prescriptions    Medication Sig Dispense Auth. Provider   montelukast (SINGULAIR) 10 MG tablet Take 1 tablet (10 mg total) by mouth at bedtime. 30 tablet Jeren Dufrane A, NP   triamcinolone (NASACORT) 55 MCG/ACT AERO nasal inhaler Place 2 sprays into the nose daily. 1 Inhaler Vasilisa Vore A, NP   predniSONE (DELTASONE) 10 MG tablet Take 2 tablets (20 mg total) by mouth daily for 5 days. 10 tablet Dahlia ByesBast, Darrian Goodwill A, NP     Controlled Substance Prescriptions [place] Controlled Substance Registry consulted? Not Applicable   Janace ArisBast, Latonia Conrow A, NP 06/17/19 1038

## 2019-06-23 ENCOUNTER — Telehealth: Payer: Self-pay | Admitting: Family Medicine

## 2019-06-23 NOTE — Telephone Encounter (Signed)
Patient called stating he was told he has to get a letter for work stating he has allergies and asthma. Please follow up.  Patient states that his employer will be faxing over documentation. Please follow up.

## 2019-06-23 NOTE — Telephone Encounter (Signed)
Patient called returned through interpeter.  Patient did not answer and no voice mail was available.  Will call at a later time.

## 2019-07-25 ENCOUNTER — Telehealth: Payer: Self-pay | Admitting: *Deleted

## 2019-07-25 NOTE — Telephone Encounter (Signed)
Staff faxed forms to [facility] management center and information will be sent to scan center.

## 2019-07-31 ENCOUNTER — Ambulatory Visit: Payer: BC Managed Care – PPO | Admitting: Family Medicine

## 2019-09-05 ENCOUNTER — Ambulatory Visit: Payer: BC Managed Care – PPO | Admitting: Family Medicine

## 2019-09-08 ENCOUNTER — Ambulatory Visit: Payer: BC Managed Care – PPO | Admitting: Sports Medicine

## 2019-12-11 ENCOUNTER — Other Ambulatory Visit: Payer: Self-pay

## 2019-12-11 ENCOUNTER — Ambulatory Visit (INDEPENDENT_AMBULATORY_CARE_PROVIDER_SITE_OTHER): Payer: BC Managed Care – PPO | Admitting: Sports Medicine

## 2019-12-11 VITALS — BP 112/84 | Wt 180.0 lb

## 2019-12-11 DIAGNOSIS — M25572 Pain in left ankle and joints of left foot: Secondary | ICD-10-CM

## 2019-12-11 MED ORDER — DICLOFENAC SODIUM 75 MG PO TBEC: 75.0000 mg | DELAYED_RELEASE_TABLET | Freq: Two times a day (BID) | ORAL | 1 refills | Status: DC | PRN

## 2019-12-11 NOTE — Progress Notes (Signed)
  Ernest Griffin - 44 y.o. male MRN [id number]  Date of birth: 1976/11/17  Interpretor was present throughout visit but was not used   SUBJECTIVE:   CC: left ankle pain   44 yo male presenting with medial ankle pain for the past 2 months. He reports that he was playing soccer a couple of months ago and kicked the ball with his toe. He has slight pain in his ankle after the game but was able to keep playing and he has continued to play since then. He reports intermittent pain while walking. When he has pain, ibuprofen helps. No swelling, bruising, weakness noted.  He has been seen before for plantar fascia- he has pes planus- he wears green sports insoles in some of his shoes but not his soccer shoes. He has tried a scaphoid pad in the past but they were not comfortable.  ROS: No unexpected weight loss, fever, chills, swelling, instability, muscle pain, numbness/tingling, redness, otherwise see HPI   PMHx - Updated and reviewed.  Contributory factors include: Negative PSHx - Updated and reviewed.  Contributory factors include:  Negative FHx - Updated and reviewed.  Contributory factors include:  Negative Social Hx - Updated and reviewed. Contributory factors include: Negative Medications - reviewed   DATA REVIEWED: none  PHYSICAL EXAM:  VS: BP:112/84  HR: bpm  TEMP: ( )  RESP:   HT:    WT:180 lb (81.6 kg)  BMI:  PHYSICAL EXAM: Gen: NAD, alert, cooperative with exam, well-appearing HEENT: clear conjunctiva,  CV:  no edema, capillary refill brisk, normal rate Resp: non-labored Skin: no rashes, normal turgor  Neuro: no gross deficits.  Psych:  alert and oriented  Right foot and ankle Ankle: - Inspection: No obvious deformity, erythema, swelling, or ecchymosis, ulcers, calluses, blisters - Palpation: TTP over medial ankle over medial talus - Strength: Normal strength with dorsiflexion, plantarflexion, inversion, and eversion of foot; flexion and extension of toes - ROM: Full ROM- no  pain - Neuro/vasc: NV intact - Special Tests: Negative anterior drawer, normal inversion test.  Negative syndesmotic compression.  Feet:  Pes planus with calcaneal valgus normal arch flexibility  [place] right ankle, limited - Anterior joint: No evidence of ankle effusion.  Tibiotalar joint with arthritis. Medial talus with bone spurring. - Tibial posterior: Evaluate longitudinal and transverse planes.  Tendon intact with no evidence of tenosynovitis   ASSESSMENT & PLAN:  Medial right ankle pain with bone spurring over medial ankle joint on ultrasound. Posterior tibialis is normal on ultrasound. Suspect that his pain is secondary to arthritis with stress on medial foot/ankle due to pes planus and calcaneal valgus.  Patient seen and evaluated with the sports medicine fellow.  I agree with the above plan of care.

## 2019-12-11 NOTE — Patient Instructions (Signed)
For arthritis, you can take glucosamine (1000 mg twice daily) and tumeric (500 mg twice daily) which can help.  For ankle pain, you can take voltaren twice daily as needed. You can also buy voltaren gel to put onto the sore part of your ankle.  Return as needed.

## 2020-01-04 NOTE — Progress Notes (Signed)
Subjective:    Patient ID: Ernest Griffin, male    DOB: November 04, 1976, 44 y.o.   MRN: [id number]  This is a 44 year old Arabic male who comes in today for work in visit for increased irritation in the eyes nosebleeds and having failed oral antihistamines for conjunctivitis which is allergic in nature and also asthma care  The patient states he has episodes of wheezing and shortness of breath 3-4 times weekly he also notes he is having nightly and daily irritation in the eyes with redness in the eyes itchiness in the eyes the nose is difficult to breathe through and stays congested.  The patient denies headaches.  He works in a Field seismologist.  He states he also has digestive issues with eats hard-boiled eggs and eggplant  He denies having ever been tested for allergies.  He was to be on Symbicort and albuterol but is ran out of these medications.  He states antihistamines orally did not help.  See asthma assessment below    Asthma He complains of chest tightness, difficulty breathing, frequent throat clearing, hoarse voice, shortness of breath and wheezing. There is no cough, hemoptysis or sputum production. This is a chronic problem. The current episode started more than 1 year ago. The problem occurs every several days. The problem has been gradually worsening. Associated symptoms include heartburn, nasal congestion, postnasal drip, rhinorrhea and sneezing. Pertinent negatives include no appetite change, chest pain, dyspnea on exertion, ear congestion, ear pain, fever, headaches, malaise/fatigue, myalgias, orthopnea, PND, sore throat or trouble swallowing. Associated symptoms comments: Itchy ears nose eyes . His symptoms are aggravated by exposure to fumes, exposure to smoke, pollen, eating, URI and change in weather (odors, car fumes perfume ,  no mold  in home). His symptoms are alleviated by beta-agonist. He reports moderate improvement on treatment. His past medical history is significant  for asthma.    Past Medical History:  Diagnosis Date  . Asthma      Family History  Problem Relation Age of Onset  . Healthy Father      Social History   Socioeconomic History  . Marital status: Married    Spouse name: Not on file  . Number of children: Not on file  . Years of education: Not on file  . Highest education level: Not on file  Occupational History  . Occupation: [employer]   Tobacco Use  . Smoking status: Never Smoker  . Smokeless tobacco: Never Used  Substance and Sexual Activity  . Alcohol use: No    Alcohol/week: 0.0 standard drinks  . Drug use: No  . Sexual activity: Yes  Other Topics Concern  . Not on file  Social History Narrative   Works at [employer]    From [place]    Social Determinants of [employer] Strain:   . Difficulty of Paying Living Expenses: Not on file  Food Insecurity:   . Worried About Programme researcher, broadcasting/film/video in the Last Year: Not on file  . Ran Out of Food in the Last Year: Not on file  Transportation Needs:   . Lack of Transportation (Medical): Not on file  . Lack of Transportation (Non-Medical): Not on file  Physical Activity:   . Days of Exercise per Week: Not on file  . Minutes of Exercise per Session: Not on file  Stress:   . Feeling of Stress : Not on file  Social Connections:   . Frequency of Communication with Friends and  Family: Not on file  . Frequency of Social Gatherings with Friends and Family: Not on file  . Attends Religious Services: Not on file  . Active Member of Clubs or Organizations: Not on file  . Attends Banker Meetings: Not on file  . Marital Status: Not on file  Intimate Partner Violence:   . Fear of Current or Ex-Partner: Not on file  . Emotionally Abused: Not on file  . Physically Abused: Not on file  . Sexually Abused: Not on file     Allergies  Allergen Reactions  . Caffeine   . Ibuprofen     Pruritis   . Strawberry (Diagnostic) Itching      Outpatient Medications Prior to Visit  Medication Sig Dispense Refill  . albuterol (VENTOLIN HFA) 108 (90 Base) MCG/ACT inhaler Inhale 2 puffs into the lungs every 6 (six) hours as needed for wheezing or shortness of breath. Needs office visit (Patient not taking: Reported on 01/05/2020) 1 Inhaler 1  . budesonide-formoterol (SYMBICORT) 160-4.5 MCG/ACT inhaler Inhale 2 puffs into the lungs 2 (two) times daily. (Patient not taking: Reported on 01/05/2020) 1 Inhaler 1  . diclofenac (VOLTAREN) 75 MG EC tablet Take 1 tablet (75 mg total) by mouth 2 (two) times daily as needed. (Patient not taking: Reported on 01/05/2020) 60 tablet 1  . ipratropium (ATROVENT) 0.06 % nasal spray Place 2 sprays into both nostrils 4 (four) times daily. (Patient not taking: Reported on 01/05/2020) 15 mL 0  . montelukast (SINGULAIR) 10 MG tablet Take 1 tablet (10 mg total) by mouth at bedtime. (Patient not taking: Reported on 01/05/2020) 30 tablet 3  . triamcinolone (NASACORT) 55 MCG/ACT AERO nasal inhaler Place 2 sprays into the nose daily. (Patient not taking: Reported on 01/05/2020) 1 Inhaler 12   No facility-administered medications prior to visit.     Review of Systems  Constitutional: Negative for appetite change, fever and malaise/fatigue.  HENT: Positive for hoarse voice, postnasal drip, rhinorrhea and sneezing. Negative for ear pain, sore throat and trouble swallowing.   Respiratory: Positive for shortness of breath and wheezing. Negative for cough, hemoptysis and sputum production.   Cardiovascular: Negative for chest pain, dyspnea on exertion and PND.  Gastrointestinal: Positive for heartburn.  Musculoskeletal: Negative for myalgias.  Neurological: Negative for headaches.       Objective:   Physical Exam Vitals:   01/05/20 1109  BP: 115/77  Pulse: 82  Resp: 16  Temp: 98.6 F (37 C)  TempSrc: Oral  SpO2: 99%  Weight: 183 lb (83 kg)    Gen: Pleasant, well-nourished, in no distress,  normal affect  ENT:  No lesions,  mouth clear, severe nasal turbinate edema without purulence, 4+ postnasal drainage, eyes are mildly irritated with conjunctival  injection  Neck: No JVD, no TMG, no carotid bruits  Lungs: No use of accessory muscles, no dullness to percussion, few faint expiratory wheezes distant breath sounds Cardiovascular: RRR, heart sounds normal, no murmur or gallops, no peripheral edema  Abdomen: soft and NT, no HSM,  BS normal  Musculoskeletal: No deformities, no cyanosis or clubbing  Neuro: alert, non focal  Skin: Warm, no lesions or rashes       Assessment & Plan:  I personally reviewed all images and lab data in the [facility] system as well as any outside material available during this office visit and agree with the  radiology impressions.   Rhinitis, allergic Severe allergic rhinitis with associated allergic conjunctivitis of both eyes and asthma  Based  on patient's symptom complex and exposure history  Would like to check food allergies environmental allergies IgE levels  We will begin Flonase 2 sprays each nostril twice daily for 1 week then reduce to once daily thereafter we will also begin Astelin 2 sprays each nostril twice daily    Asthma Moderate persistent asthma with mild exacerbation Plan will be to give pulse prednisone 40 mg a day for 5 days and discontinue, will resume Symbicort 2 inhalations twice daily 160 strength and albuterol as needed  Review allergic rhinitis assessment for nasal steroid and nasal antihistamine  I went over the patient the proper use of the inhaled medications  Allergic conjunctivitis of both eyes Bilateral allergic conjunctivitis both eyes  Plan to begin olopatadine 0.1% ophthalmic solution 1 drop in both eyes twice daily  Hoarseness History of pre-existing hoarseness   Reflux disease may be playing a role   Diagnoses and all orders for this visit:  Non-seasonal allergic rhinitis due to other allergic trigger -     Allergen  Profile, Mold -     IgE -     Food Allergy Profile  Mild intermittent asthma without complication -     albuterol (VENTOLIN HFA) 108 (90 Base) MCG/ACT inhaler; Inhale 2 puffs into the lungs every 6 (six) hours as needed for wheezing or shortness of breath. Needs office visit  Moderate persistent asthma with acute exacerbation -     Allergen Profile, Mold -     IgE -     Food Allergy Profile -     budesonide-formoterol (SYMBICORT) 160-4.5 MCG/ACT inhaler; Inhale 2 puffs into the lungs 2 (two) times daily.  Allergic conjunctivitis of both eyes  Need for immunization against influenza -     Flu Vaccine QUAD 36+ mos IM  Hoarseness  Other orders -     fluticasone (FLONASE) 50 MCG/ACT nasal spray; Place 2 sprays into both nostrils 2 (two) times daily. -     azelastine (ASTELIN) 0.1 % nasal spray; Place 2 sprays into both nostrils 2 (two) times daily. Use in each nostril as directed -     predniSONE (DELTASONE) 10 MG tablet; Take 4 tablets daily for 5 days then stop -     olopatadine (PATADAY) 0.1 % ophthalmic solution; Place 1 drop into both eyes 2 (two) times daily. -     Tdap vaccine greater than or equal to 7yo IM   Note a tetanus and flu vaccine were given

## 2020-01-05 ENCOUNTER — Ambulatory Visit: Payer: BC Managed Care – PPO | Attending: Critical Care Medicine | Admitting: Critical Care Medicine

## 2020-01-05 ENCOUNTER — Encounter: Payer: Self-pay | Admitting: Critical Care Medicine

## 2020-01-05 ENCOUNTER — Other Ambulatory Visit: Payer: Self-pay

## 2020-01-05 VITALS — BP 115/77 | HR 82 | Temp 98.6°F | Resp 16 | Wt 183.0 lb

## 2020-01-05 DIAGNOSIS — H1013 Acute atopic conjunctivitis, bilateral: Secondary | ICD-10-CM | POA: Diagnosis not present

## 2020-01-05 DIAGNOSIS — J4541 Moderate persistent asthma with (acute) exacerbation: Secondary | ICD-10-CM | POA: Diagnosis not present

## 2020-01-05 DIAGNOSIS — Z23 Encounter for immunization: Secondary | ICD-10-CM | POA: Diagnosis not present

## 2020-01-05 DIAGNOSIS — J3089 Other allergic rhinitis: Secondary | ICD-10-CM

## 2020-01-05 DIAGNOSIS — R490 Dysphonia: Secondary | ICD-10-CM

## 2020-01-05 DIAGNOSIS — J4520 Mild intermittent asthma, uncomplicated: Secondary | ICD-10-CM

## 2020-01-05 MED ORDER — PREDNISONE 10 MG PO TABS: ORAL_TABLET | ORAL | 0 refills | Status: DC

## 2020-01-05 MED ORDER — OLOPATADINE HCL 0.1 % OP SOLN: 1.0000 [drp] | Freq: Two times a day (BID) | OPHTHALMIC | 4 refills | Status: DC

## 2020-01-05 MED ORDER — BUDESONIDE-FORMOTEROL FUMARATE 160-4.5 MCG/ACT IN AERO: 2.0000 | INHALATION_SPRAY | Freq: Two times a day (BID) | RESPIRATORY_TRACT | 2 refills | Status: DC

## 2020-01-05 MED ORDER — AZELASTINE HCL 0.1 % NA SOLN: 2.0000 | Freq: Two times a day (BID) | NASAL | 3 refills | Status: DC

## 2020-01-05 MED ORDER — ALBUTEROL SULFATE HFA 108 (90 BASE) MCG/ACT IN AERS: 2.0000 | INHALATION_SPRAY | Freq: Four times a day (QID) | RESPIRATORY_TRACT | 2 refills | Status: DC | PRN

## 2020-01-05 MED ORDER — FLUTICASONE PROPIONATE 50 MCG/ACT NA SUSP: 2.0000 | Freq: Two times a day (BID) | NASAL | 2 refills | Status: DC

## 2020-01-05 NOTE — Progress Notes (Signed)
Nose bleeds in the a.m.  Itchy and red eyes  Several months now Tried zyrtec  Mervat [phone]

## 2020-01-05 NOTE — Assessment & Plan Note (Signed)
Moderate persistent asthma with mild exacerbation Plan will be to give pulse prednisone 40 mg a day for 5 days and discontinue, will resume Symbicort 2 inhalations twice daily 160 strength and albuterol as needed  Review allergic rhinitis assessment for nasal steroid and nasal antihistamine  I went over the patient the proper use of the inhaled medications

## 2020-01-05 NOTE — Assessment & Plan Note (Signed)
Bilateral allergic conjunctivitis both eyes  Plan to begin olopatadine 0.1% ophthalmic solution 1 drop in both eyes twice daily

## 2020-01-05 NOTE — Assessment & Plan Note (Signed)
Severe allergic rhinitis with associated allergic conjunctivitis of both eyes and asthma  Based on patient's symptom complex and exposure history  Would like to check food allergies environmental allergies IgE levels  We will begin Flonase 2 sprays each nostril twice daily for 1 week then reduce to once daily thereafter we will also begin Astelin 2 sprays each nostril twice daily

## 2020-01-05 NOTE — Assessment & Plan Note (Signed)
History of pre-existing hoarseness   Reflux disease may be playing a role

## 2020-01-05 NOTE — Patient Instructions (Addendum)
Resume Symbicort 2 inhalations twice daily Use albuterol 2 inhalations every 4 hours as needed for severe shortness of breath Begin prednisone 10 mg take 4 daily for 5 days then discontinue  Resume Flonase 2 sprays each nostril twice a day for 1 week then reduce to once daily 2 sprays each nostril  Begin Astelin 2 sprays each nostril 2 times a day  Begin eyedrops Pataday 1 drop in each eye 2 times daily for eye irritation  Labs today will include allergy panel for food and environmental allergens  A tetanus vaccine and flu vaccine were given at this visit  Keep your primary care appointments with Dr. Jillyn Hidden and return to see Dr. Delford Field for your asthma and allergies in 1 month we will call you sooner with your lab results

## 2020-01-07 LAB — FOOD ALLERGY PROFILE
Allergen Corn, IgE: 0.2 kU/L — AB
Clam IgE: 20.7 kU/L — AB
Codfish IgE: 0.76 kU/L — AB
Egg White IgE: 0.51 kU/L — AB
Milk IgE: <0.1 kU/L
Peanut IgE: 0.37 kU/L — AB
Scallop IgE: 35.8 kU/L — AB
Sesame Seed IgE: 0.79 kU/L — AB
Shrimp IgE: >100 kU/L — AB
Soybean IgE: 0.21 kU/L — AB
Walnut IgE: 0.21 kU/L — AB
Wheat IgE: 0.15 kU/L — AB

## 2020-01-07 LAB — ALLERGEN PROFILE, MOLD
Alternaria Alternata IgE: <0.1 kU/L
Aspergillus Fumigatus IgE: 1.01 kU/L — AB
Aureobasidi Pullulans IgE: 0.2 kU/L — AB
Candida Albicans IgE: 0.34 kU/L — AB
Cladosporium Herbarum IgE: 0.13 kU/L — AB
M009-IgE Fusarium proliferatum: 1.8 kU/L — AB
M014-IgE Epicoccum purpur: 0.64 kU/L — AB
Mucor Racemosus IgE: 0.13 kU/L — AB
Penicillium Chrysogen IgE: <0.1 kU/L
Phoma Betae IgE: 0.12 kU/L — AB
Setomelanomma Rostrat: 0.67 kU/L — AB
Stemphylium Herbarum IgE: 0.16 kU/L — AB

## 2020-01-07 LAB — IGE: IgE (Immunoglobulin E), Serum: 1248 IU/mL — ABNORMAL HIGH (ref 6–495)

## 2020-01-08 ENCOUNTER — Other Ambulatory Visit: Payer: Self-pay | Admitting: Critical Care Medicine

## 2020-01-08 DIAGNOSIS — R768 Other specified abnormal immunological findings in serum: Secondary | ICD-10-CM

## 2020-01-08 DIAGNOSIS — J3089 Other allergic rhinitis: Secondary | ICD-10-CM

## 2020-01-08 DIAGNOSIS — H1013 Acute atopic conjunctivitis, bilateral: Secondary | ICD-10-CM

## 2020-01-08 DIAGNOSIS — Z91048 Other nonmedicinal substance allergy status: Secondary | ICD-10-CM

## 2020-01-08 DIAGNOSIS — J4541 Moderate persistent asthma with (acute) exacerbation: Secondary | ICD-10-CM

## 2020-01-27 ENCOUNTER — Ambulatory Visit: Payer: BC Managed Care – PPO | Admitting: Sports Medicine

## 2020-01-27 ENCOUNTER — Encounter: Payer: Self-pay | Admitting: *Deleted

## 2020-01-29 ENCOUNTER — Ambulatory Visit: Payer: BC Managed Care – PPO | Admitting: Sports Medicine

## 2020-02-09 ENCOUNTER — Other Ambulatory Visit: Payer: Self-pay

## 2020-02-09 ENCOUNTER — Ambulatory Visit: Payer: BC Managed Care – PPO | Attending: Family Medicine | Admitting: Family Medicine

## 2020-02-12 ENCOUNTER — Other Ambulatory Visit: Payer: Self-pay

## 2020-02-12 ENCOUNTER — Ambulatory Visit (INDEPENDENT_AMBULATORY_CARE_PROVIDER_SITE_OTHER): Payer: BC Managed Care – PPO | Admitting: Sports Medicine

## 2020-02-12 VITALS — BP 112/82 | Ht 67.0 in | Wt 186.0 lb

## 2020-02-12 DIAGNOSIS — M25572 Pain in left ankle and joints of left foot: Secondary | ICD-10-CM | POA: Diagnosis not present

## 2020-02-12 NOTE — Patient Instructions (Addendum)
It was great to see you!  Our plans for today:  - Try an ankle sleeve for extra support. - We will get xrays to evaluate for arthritis causing your pain. We will call you with these results.  - Take tumeric daily to help with pain. - Continue to use voltaren gel as needed for pain.

## 2020-02-12 NOTE — Progress Notes (Addendum)
    SUBJECTIVE:   CHIEF COMPLAINT: L ankle pain f/u  HPI:   Previously seen 1/7 for 2 month h/o medial ankle pain after playing soccer. [place] at that time with bone spurring over medial ankle joint. Pain thought to be 2/2 arthritis with stress on medial foot/ankle due to pes planus and calcaneal valgus. Recommended glucosamine, tumeric and prn voltaren gel.  Patient endorses continued and worsened pain. Denies additional injury or trauma. Now has pain with walking and standing and occsionally has pain upon waking in the morning. He tried the tumeric but could not take the glucosamine due to religious beliefs surrounding consuming gelatin. Voltaren gel helped a little. Also tried ibuprofen without relief. Denies redness, swelling, calf or knee pain. Mainly wears boots but does not have them with him today. Has prior inserts provided with improvement in pain.  PERTINENT  PMH / PSH: pes planus, plantar fasciitis  OBJECTIVE:   BP 112/82   Ht 5\' 7"  (1.702 m)   Wt 186 lb (84.4 kg)   BMI 29.13 kg/m   Gen: well appearing, in NAD MSK: Foot: Inspection: no swelling, erythema, obvious deformities. Pes planus. Palpation: TTP inferior to medial and lateral malleoli, medial>lateral. nonTTP along metatarsals. ROM: intact full AROM. Strength: 5/5 with resisted dorsiflexion, plantarflexion, inversion and eversion but with pain with inversion and eversion Stability: no joint laxity with anterior/posterior drawer. Neurovascular: intact  ASSESSMENT/PLAN:   Left ankle pain Likely 2/2 arthritis seen on prior with pes planus deformity. Will obtain ankle XR to better assess joint space. Continue tumeric and voltaren gel for pain relief. Wear inserts regularly. Follow up in 3 weeks. If imaging negative, can consider follow up imaging with MRI to better assess causation of pain.   [place], DO PGY-3, [facility] 02/12/2020 11:48 AM   Patient seen and evaluated with the resident.  I  agree with the above plan of care.  Like to get an x-ray of his left ankle.  We will also try a compression sleeve as well as turmeric.  Follow-up in 3 weeks for reevaluation.  If x-rays are unremarkable, consider MRI.

## 2020-02-12 NOTE — Assessment & Plan Note (Addendum)
Likely 2/2 arthritis seen on prior US with pes planus deformity. Will obtain ankle XR to better assess joint space. Continue tumeric and voltaren gel for pain relief. Wear inserts regularly. Follow up in 3 weeks. If imaging negative, can consider follow up imaging with MRI to better assess causation of pain.

## 2020-03-01 ENCOUNTER — Ambulatory Visit
Admission: RE | Admit: 2020-03-01 | Discharge: 2020-03-01 | Disposition: A | Discharge status: Home / Self Care | Payer: BC Managed Care – PPO | Source: Ambulatory Visit | Attending: Sports Medicine | Admitting: Sports Medicine

## 2020-03-01 DIAGNOSIS — M25572 Pain in left ankle and joints of left foot: Secondary | ICD-10-CM

## 2020-03-09 ENCOUNTER — Ambulatory Visit (INDEPENDENT_AMBULATORY_CARE_PROVIDER_SITE_OTHER): Payer: BC Managed Care – PPO | Admitting: Sports Medicine

## 2020-03-09 ENCOUNTER — Other Ambulatory Visit: Payer: Self-pay

## 2020-03-09 VITALS — BP 110/84 | Ht 68.0 in | Wt 186.0 lb

## 2020-03-09 DIAGNOSIS — M25572 Pain in left ankle and joints of left foot: Secondary | ICD-10-CM

## 2020-03-09 DIAGNOSIS — G8929 Other chronic pain: Secondary | ICD-10-CM

## 2020-03-10 NOTE — Progress Notes (Signed)
   Subjective:    Patient ID: Ernest Griffin, male    DOB: [birth year], 44 y.o.   MRN: [id number]  HPI   Patient presents today for follow-up on medial right ankle pain.  He continues to have pain despite wearing a compression sleeve and using his green sports insoles.  Although a previous ultrasound had shown some mild degenerative changes in the tibiotalar joint, a recent x-ray of the ankle was fairly unremarkable.  He continues to localize his pain along the distal medial malleolus.  It is most noticeable with prolonged walking or with placing a lot of pressure down on his heel such as with jumping.  He denies any swelling.  No weakness.  Symptoms all began after a soccer injury several months ago.    Review of Systems    As above Objective:   Physical Exam  Well-developed, well-nourished.  No acute distress.  Awake alert and oriented x3.  Vital signs reviewed  Right ankle: Full range of motion.  No effusion.  No soft tissue swelling.  No obvious deformity.  Pes planus with standing but good calcaneal inversion when standing on his tiptoes.  Patient is tender to palpation along the distalmost aspect of the medial malleolus.  Some tenderness to palpation along the posterior tibialis tendon as well.  No other bony or soft tissue tenderness to palpation in the foot or lateral ankle.  Neurovascular intact distally.  Walking without a significant limp.  X-ray and ultrasound results as above      Assessment & Plan:   Chronic right ankle pain status post remote right ankle injury  Patient's symptoms all began with a traumatic injury while playing soccer late last year.  Conservative treatment to date has been ineffective and ultrasound and x-ray have been basically nondiagnostic.  Therefore, I recommend proceeding with an MRI scan of his right ankle to rule out soft tissue injury not seen on those previous studies.  Patient will follow up with me in the office after the MRI and we will delineate a  more definitive treatment plan based on those findings.

## 2020-03-22 ENCOUNTER — Other Ambulatory Visit: Payer: Self-pay

## 2020-03-22 ENCOUNTER — Encounter (HOSPITAL_COMMUNITY): Payer: Self-pay

## 2020-03-22 ENCOUNTER — Ambulatory Visit (HOSPITAL_COMMUNITY)
Admission: EM | Admit: 2020-03-22 | Discharge: 2020-03-22 | Disposition: A | Discharge status: Home / Self Care | Payer: BC Managed Care – PPO | Attending: Physician Assistant | Admitting: Physician Assistant

## 2020-03-22 DIAGNOSIS — M25511 Pain in right shoulder: Secondary | ICD-10-CM

## 2020-03-22 DIAGNOSIS — J4541 Moderate persistent asthma with (acute) exacerbation: Secondary | ICD-10-CM

## 2020-03-22 DIAGNOSIS — M722 Plantar fascial fibromatosis: Secondary | ICD-10-CM | POA: Diagnosis not present

## 2020-03-22 DIAGNOSIS — J4520 Mild intermittent asthma, uncomplicated: Secondary | ICD-10-CM

## 2020-03-22 DIAGNOSIS — J4531 Mild persistent asthma with (acute) exacerbation: Secondary | ICD-10-CM

## 2020-03-22 MED ORDER — BUDESONIDE-FORMOTEROL FUMARATE 160-4.5 MCG/ACT IN AERO: 2.0000 | INHALATION_SPRAY | Freq: Two times a day (BID) | RESPIRATORY_TRACT | 1 refills | Status: DC

## 2020-03-22 MED ORDER — ALBUTEROL SULFATE HFA 108 (90 BASE) MCG/ACT IN AERS: 2.0000 | INHALATION_SPRAY | Freq: Four times a day (QID) | RESPIRATORY_TRACT | 1 refills | Status: DC | PRN

## 2020-03-22 MED ORDER — PREDNISONE 50 MG PO TABS: 50.0000 mg | ORAL_TABLET | Freq: Every day | ORAL | 0 refills | Status: AC

## 2020-03-22 NOTE — ED Triage Notes (Signed)
Pt is here with allergies & right shoulder pain that started. Pt has taken to relieve discomfort.

## 2020-03-22 NOTE — ED Triage Notes (Signed)
Pt states he has right shoulder pain and his allergies are flaring up x 2 weeks.

## 2020-03-22 NOTE — Discharge Instructions (Addendum)
Take the prednisone for 5 days, then continue ibuprofen therapy  Restart your inhaler and use albuterol as needed for wheezing and shortness of breath  Follow up with the sports medicine group if your shoulder continues to cause pain despite out initial treatments  Seek out arch support insoles  Try the exercises for your feet

## 2020-03-22 NOTE — ED Provider Notes (Signed)
[facility]    CSN: [id number] Arrival date & time: 03/22/20  1756      History   Chief Complaint Chief Complaint  Patient presents with  . Allergies  . Shoulder Pain    HPI Ernest Griffin is a 44 y.o. male.   Patient reports urgent care for evaluation of asthma and allergy symptoms.  He reports his allergies been flaring with sneezing and runny nose recently.  He reports he has been out of his Symbicort inhaler and has been a little more short of breath recently.  Has been using his albuterol inhaler fairly frequently as he has felt short of breath with mild wheezing.  He reports taking an allergy medicine however is unsure the name.  He believes it may be similar to Zyrtec.  Denies cough, headache, nausea, vomiting, diarrhea.  He also reports right shoulder pain has been present for about 1 week.  Reports the pain feels inside of his shoulder.  Reports pain with motion.  Reports he is right-handed and does a lot of repetitive lifting at work.  Denies any numbness or weakness in the right arm.  No previous injuries to the shoulder.  He also reports heel pain bilaterally primarily in the left leg.  Reports chronic ankle pains that is followed by sports medicine however reports that this heel pain is different than his chronic ankle pain.  He points to the inside portion of the heel and into the bottom of the foot.  He reports this hurts after long days of work and is sometimes worse first thing in the morning.  He reports sometimes he feels like he cannot walk due to the pain.     Past Medical History:  Diagnosis Date  . Asthma     Patient Active Problem List   Diagnosis Date Noted  . Left ankle pain 02/12/2020  . Decreased vision of right eye 09/07/2016  . Allergic conjunctivitis of both eyes 09/07/2016  . Hoarseness 04/24/2016  . Borborygmi 04/24/2016  . Rhinitis, allergic 04/24/2016  . Asthma 01/17/2016    History reviewed. No pertinent surgical  history.     Home Medications    Prior to Admission medications   Medication Sig Start Date End Date Taking? Authorizing Provider  albuterol (VENTOLIN HFA) 108 (90 Base) MCG/ACT inhaler Inhale 2 puffs into the lungs every 6 (six) hours as needed for wheezing or shortness of breath. Needs office visit 03/22/20   Dravon Nott, Veryl Speak, PA-C  azelastine (ASTELIN) 0.1 % nasal spray Place 2 sprays into both nostrils 2 (two) times daily. Use in each nostril as directed Patient not taking: Reported on 02/12/2020 01/05/20   Storm Frisk, MD  budesonide-formoterol [facility]) 160-4.5 MCG/ACT inhaler Inhale 2 puffs into the lungs 2 (two) times daily. 03/22/20   Chukwudi Ewen, Veryl Speak, PA-C  fluticasone (FLONASE) 50 MCG/ACT nasal spray Place 2 sprays into both nostrils 2 (two) times daily. 01/05/20 02/04/20  Storm Frisk, MD  olopatadine (PATADAY) 0.1 % ophthalmic solution Place 1 drop into both eyes 2 (two) times daily. Patient not taking: Reported on 02/12/2020 01/05/20   Storm Frisk, MD  predniSONE (DELTASONE) 50 MG tablet Take 1 tablet (50 mg total) by mouth daily with breakfast for 5 days. 03/22/20 03/27/20  Stephane Junkins, Veryl Speak, PA-C  levocetirizine (XYZAL) 5 MG tablet Take 1 tablet (5 mg total) by mouth every evening. To decrease allergy symptoms 04/02/19 06/16/19  Cain Saupe, MD    Family History Family History  Problem  Relation Age of Onset  . Healthy Father     Social History Social History   Tobacco Use  . Smoking status: Never Smoker  . Smokeless tobacco: Never Used  Substance Use Topics  . Alcohol use: No    Alcohol/week: 0.0 standard drinks  . Drug use: No     Allergies   Caffeine, Ibuprofen, and Strawberry (diagnostic)   Review of Systems Review of Systems  See HPI Physical Exam Triage Vital Signs ED Triage Vitals  Enc Vitals Group     BP 03/22/20 1835 106/73     Pulse Rate 03/22/20 1835 98     Resp 03/22/20 1835 18     Temp 03/22/20 1835 98.3 F (36.8 C)     Temp Source  03/22/20 1831 Oral     SpO2 03/22/20 1835 100 %     Weight 03/22/20 1837 186 lb (84.4 kg)     Height --      Head Circumference --      Peak Flow --      Pain Score 03/22/20 1837 5     Pain Loc --      Pain Edu? --      Excl. in [place]? --    No data found.  Updated Vital Signs BP 106/73 (BP Location: Right Arm)   Pulse 98   Temp 98.3 F (36.8 C) (Oral)   Resp 18   Wt 186 lb (84.4 kg)   SpO2 100%   BMI 28.28 kg/m   Visual Acuity Right Eye Distance:   Left Eye Distance:   Bilateral Distance:    Right Eye Near:   Left Eye Near:    Bilateral Near:     Physical Exam Vitals and nursing note reviewed.  Constitutional:      General: He is not in acute distress.    Appearance: He is well-developed. He is not ill-appearing.  HENT:     Head: Normocephalic and atraumatic.  Eyes:     Conjunctiva/sclera: Conjunctivae normal.  Cardiovascular:     Rate and Rhythm: Normal rate and regular rhythm.     Heart sounds: No murmur.  Pulmonary:     Effort: Pulmonary effort is normal. No respiratory distress.     Breath sounds: Normal breath sounds. No wheezing or rhonchi.     Comments: Saturating well 100% speaking in full sentences without issue. Abdominal:     Palpations: Abdomen is soft.     Tenderness: There is no abdominal tenderness.  Musculoskeletal:     Cervical back: Neck supple.     Comments: No obvious deformity of the right shoulder.  No ecchymosis or swelling.  Patient has reported pain with range of motion however has full range of motion with circumduction, abduction, adduction, flexion and extension of the shoulder.  There is elicited pain with empty can test.  There is reported pain with slow lowering of the arm.  Patient has tenderness to palpation over the medial aspect of the left heel and right heel.  No significant tenderness over the plantar fascia.  Patient has pes planus.  Skin:    General: Skin is warm and dry.  Neurological:     Mental Status: He is alert.       UC Treatments / Results  Labs (all labs ordered are listed, but only abnormal results are displayed) Labs Reviewed - No data to display  EKG   Radiology No results found.  Procedures Procedures (including critical care time)  Medications Ordered in  UC Medications - No data to display  Initial Impression / Assessment and Plan / UC Course  I have reviewed the triage vital signs and the nursing notes.  Pertinent labs & imaging results that were available during my care of the patient were reviewed by me and considered in my medical decision making (see chart for details).    #Mild persistent asthma with mild exacerbation #Right shoulder pain #Bilateral plantar fasciitis Patient is a 44 year old male history of seasonal allergies and asthma presenting with mild asthma exacerbation secondary to seasonal allergies.  Patient is not currently wheezing and respiratory status is good.  As well he has right shoulder pain that given age and repetitive motions is likely rotator cuff tendinopathy.  Doubt tear as he does have retained strength.  Given patient's pes planus and location of pain in bilateral nature do believe this is likely plantar fasciitis.  Patient is already followed by sports medicine group recommended following up with those providers for musculoskeletal complaints following completion of prednisone therapy.   - Discussed stopping ibuprofen therapy until completion of prednisone. -Instructed patient to seek out arch support soles -Basic exercises for plantar fasciitis were discussed. Final Clinical Impressions(s) / UC Diagnoses   Final diagnoses:  Mild persistent asthma with acute exacerbation  Pain in joint of right shoulder  Bilateral plantar fasciitis     Discharge Instructions     Take the prednisone for 5 days, then continue ibuprofen therapy  Restart your inhaler and use albuterol as needed for wheezing and shortness of breath  Follow up with the  sports medicine group if your shoulder continues to cause pain despite out initial treatments  Seek out arch support insoles  Try the exercises for your feet    ED Prescriptions    Medication Sig Dispense Auth. Provider   budesonide-formoterol (SYMBICORT) 160-4.5 MCG/ACT inhaler Inhale 2 puffs into the lungs 2 (two) times daily. 1 Inhaler Naomee Nowland, Veryl Speak, PA-C   albuterol (VENTOLIN HFA) 108 (90 Base) MCG/ACT inhaler Inhale 2 puffs into the lungs every 6 (six) hours as needed for wheezing or shortness of breath. Needs office visit 18 g Jodette Wik, Veryl Speak, PA-C   predniSONE (DELTASONE) 50 MG tablet Take 1 tablet (50 mg total) by mouth daily with breakfast for 5 days. 5 tablet Ashna Dorough, Veryl Speak, PA-C     PDMP not reviewed this encounter.   Hermelinda Medicus, PA-C 03/23/20 [phone]

## 2020-04-03 ENCOUNTER — Ambulatory Visit
Admission: RE | Admit: 2020-04-03 | Discharge: 2020-04-03 | Disposition: A | Discharge status: Home / Self Care | Payer: BC Managed Care – PPO | Source: Ambulatory Visit | Attending: Sports Medicine | Admitting: Sports Medicine

## 2020-04-03 ENCOUNTER — Other Ambulatory Visit: Payer: Self-pay

## 2020-04-03 DIAGNOSIS — G8929 Other chronic pain: Secondary | ICD-10-CM

## 2020-04-03 DIAGNOSIS — M25572 Pain in left ankle and joints of left foot: Secondary | ICD-10-CM

## 2020-04-08 ENCOUNTER — Ambulatory Visit: Payer: BC Managed Care – PPO | Admitting: Sports Medicine

## 2020-04-13 ENCOUNTER — Other Ambulatory Visit: Payer: Self-pay

## 2020-04-13 ENCOUNTER — Ambulatory Visit (INDEPENDENT_AMBULATORY_CARE_PROVIDER_SITE_OTHER): Payer: BC Managed Care – PPO | Admitting: Sports Medicine

## 2020-04-13 VITALS — BP 110/82 | Ht 68.0 in | Wt 187.0 lb

## 2020-04-13 DIAGNOSIS — M25572 Pain in left ankle and joints of left foot: Secondary | ICD-10-CM | POA: Diagnosis not present

## 2020-04-13 DIAGNOSIS — M958 Other specified acquired deformities of musculoskeletal system: Secondary | ICD-10-CM | POA: Diagnosis not present

## 2020-04-13 DIAGNOSIS — G8929 Other chronic pain: Secondary | ICD-10-CM | POA: Diagnosis not present

## 2020-04-14 ENCOUNTER — Encounter: Payer: Self-pay | Admitting: Sports Medicine

## 2020-04-14 NOTE — Progress Notes (Signed)
Patient ID: Ernest Griffin, male   DOB: 1976/07/20, 44 y.o.   MRN: [id number]  Patient comes in today to discuss MRI results of his left ankle.  He has an osteochondral lesion measuring 1 cm x 1.2 cm.  It is mildly displaced.  He has had symptoms for several months.  I recommend surgical consultation to discuss merits of possible microfracture.  I will defer definitive treatment to the discretion of the surgeon and the patient will follow up with me as needed.

## 2020-04-26 ENCOUNTER — Ambulatory Visit: Payer: BC Managed Care – PPO | Admitting: Orthopedic Surgery

## 2020-05-06 ENCOUNTER — Ambulatory Visit: Payer: BC Managed Care – PPO | Admitting: Orthopedic Surgery

## 2020-05-13 ENCOUNTER — Ambulatory Visit: Payer: BC Managed Care – PPO | Admitting: Orthopedic Surgery

## 2020-05-13 ENCOUNTER — Ambulatory Visit: Payer: BC Managed Care – PPO | Admitting: Sports Medicine

## 2020-05-17 ENCOUNTER — Ambulatory Visit: Payer: BC Managed Care – PPO | Admitting: Sports Medicine

## 2020-05-17 ENCOUNTER — Encounter: Payer: Self-pay | Admitting: Orthopedic Surgery

## 2020-05-17 ENCOUNTER — Ambulatory Visit (INDEPENDENT_AMBULATORY_CARE_PROVIDER_SITE_OTHER): Payer: BC Managed Care – PPO | Admitting: Orthopedic Surgery

## 2020-05-17 VITALS — Ht 68.0 in | Wt 187.0 lb

## 2020-05-17 DIAGNOSIS — M25572 Pain in left ankle and joints of left foot: Secondary | ICD-10-CM | POA: Diagnosis not present

## 2020-05-17 MED ORDER — LIDOCAINE HCL 1 % IJ SOLN
2.0000 mL | INTRAMUSCULAR | Status: AC | PRN
  Administered 2020-05-17: 2 mL

## 2020-05-17 MED ORDER — METHYLPREDNISOLONE ACETATE 40 MG/ML IJ SUSP
40.0000 mg | INTRAMUSCULAR | Status: AC | PRN
  Administered 2020-05-17: 40 mg via INTRA_ARTICULAR

## 2020-05-17 NOTE — Progress Notes (Signed)
Office Visit Note   Patient: Ernest Griffin           Date of Birth: Aug 29, 1976           MRN: [id number] Visit Date: 05/17/2020              Requested by: Ralene Cork, DO [address] [place],  [place] [address] PCP: Cain Saupe, MD  Chief Complaint  Patient presents with  . Left Ankle - Pain, New Patient (Initial Visit)      HPI: Patient is a 44 year old gentleman who presents complaining of pain in the left ankle which he describes as chronic.  Patient is status post radiographs in March and MRI scan in May.  Patient states the pain in the left ankle has been worse over the past 6 months.  Patient works driving a Neurosurgeon.  Assessment & Plan: Visit Diagnoses:  1. Pain in left ankle and joints of left foot     Plan: Left ankle was injected he tolerated this well follow-up in 4 weeks.  Evaluate for possible arthroscopic debridement depending on his symptoms.  Patient was given a note to be out of work today.  Follow-Up Instructions: Return in about 4 weeks (around 06/14/2020).   Ortho Exam  Patient is alert, oriented, no adenopathy, well-dressed, normal affect, normal respiratory effort. Examination patient has good pulses he is tender to palpation of the medial joint line of the left ankle.  He has good subtalar motion anterior drawer is stable compression of the syndesmosis is nontender.  Peroneal and posterior tibial tendons are intact.  Review of the MRI scan shows an osteochondral defect of the medial talar dome.  Imaging: No results found. No images are attached to the encounter.  Labs: Lab Results  Component Value Date   HGBA1C 5.60 01/17/2016     Lab Results  Component Value Date   ALBUMIN 4.1 08/30/2018   ALBUMIN 4.1 01/17/2016    No results found for: MG No results found for: VD25OH  No results found for: PREALBUMIN CBC EXTENDED Latest Ref Rng & Units 08/30/2018 01/17/2016  WBC 3.4 - 10.8 x10E3/uL 4.6 4.5  RBC 4.14 - 5.80 x10E6/uL 4.59  4.51  HGB 13.0 - 17.7 g/dL 00.8 67.6  HCT 19.5 - 09.3 % 40.3 39.5  PLT 150 - 450 x10E3/uL 223 253  NEUTROABS 1 - 7 x10E3/uL 1.9 -  LYMPHSABS 0 - 3 x10E3/uL 1.3 -     Body mass index is 28.43 kg/m.  Orders:  No orders of the defined types were placed in this encounter.  No orders of the defined types were placed in this encounter.    Procedures: Medium Joint Inj: L ankle on 05/17/2020 12:36 PM Indications: pain and diagnostic evaluation Details: 22 G 1.5 in needle, anteromedial approach Medications: 2 mL lidocaine 1 %; 40 mg methylPREDNISolone acetate 40 MG/ML Outcome: tolerated well, no immediate complications Procedure, treatment alternatives, risks and benefits explained, specific risks discussed. Consent was given by the patient. Immediately prior to procedure a time out was called to verify the correct patient, procedure, equipment, support staff and site/side marked as required. Patient was prepped and draped in the usual sterile fashion.      Clinical Data: No additional findings.  ROS:  All other systems negative, except as noted in the HPI. Review of Systems  Objective: Vital Signs: Ht 5\' 8"  (1.727 m)   Wt 187 lb (84.8 kg)   BMI 28.43 kg/m   Specialty Comments:  No specialty comments available.  PMFS History: Patient Active Problem List   Diagnosis Date Noted  . Left ankle pain 02/12/2020  . Decreased vision of right eye 09/07/2016  . Allergic conjunctivitis of both eyes 09/07/2016  . Hoarseness 04/24/2016  . Borborygmi 04/24/2016  . Rhinitis, allergic 04/24/2016  . Asthma 01/17/2016   Past Medical History:  Diagnosis Date  . Asthma     Family History  Problem Relation Age of Onset  . Healthy Father     History reviewed. No pertinent surgical history. Social History   Occupational History  . Occupation: [employer]   Tobacco Use  . Smoking status: Never Smoker  . Smokeless tobacco: Never Used  Vaping Use  . Vaping Use: Never used    Substance and Sexual Activity  . Alcohol use: No    Alcohol/week: 0.0 standard drinks  . Drug use: No  . Sexual activity: Yes

## 2020-05-20 ENCOUNTER — Telehealth: Payer: Self-pay | Admitting: Family Medicine

## 2020-05-20 DIAGNOSIS — J4520 Mild intermittent asthma, uncomplicated: Secondary | ICD-10-CM

## 2020-05-20 MED ORDER — ALBUTEROL SULFATE HFA 108 (90 BASE) MCG/ACT IN AERS: 2.0000 | INHALATION_SPRAY | Freq: Four times a day (QID) | RESPIRATORY_TRACT | 0 refills | Status: DC | PRN

## 2020-05-20 MED ORDER — FLUTICASONE PROPIONATE 50 MCG/ACT NA SUSP: 2.0000 | Freq: Two times a day (BID) | NASAL | 0 refills | Status: DC

## 2020-05-20 NOTE — Telephone Encounter (Signed)
Rx sent for 1 month. Patient needs a PCP visit.

## 2020-05-20 NOTE — Telephone Encounter (Signed)
1) Medication(s) Requested (by name): fluticasone (FLONASE) 50 MCG/ACT nasal spray albuterol (VENTOLIN HFA) 108 (90 Base) MCG/ACT inhaler   2) Pharmacy of Choice: [id number] - [place], [place] - [address]  3) Special Requests:   Approved medications will be sent to the pharmacy, we will reach out if there is an issue.  Requests made after 3pm may not be addressed until the following business day!  If a patient is unsure of the name of the medication(s) please note and ask patient to call back when they are able to provide all info, do not send to responsible party until all information is available!

## 2020-06-10 ENCOUNTER — Telehealth: Payer: Self-pay | Admitting: Family Medicine

## 2020-06-10 NOTE — Telephone Encounter (Signed)
Pt called in to follow up on his FMLA medical certification paperwork. Pt says that his insurance company faxed over paperwork 3 weeks ago.    Please advise.   CB: [phone]

## 2020-06-11 NOTE — Telephone Encounter (Signed)
Call has been placed to patient to obtain more information for FMLA paperwork.

## 2020-06-14 ENCOUNTER — Encounter: Payer: Self-pay | Admitting: Orthopedic Surgery

## 2020-06-14 ENCOUNTER — Ambulatory Visit (INDEPENDENT_AMBULATORY_CARE_PROVIDER_SITE_OTHER): Payer: BC Managed Care – PPO | Admitting: Orthopedic Surgery

## 2020-06-14 DIAGNOSIS — M25872 Other specified joint disorders, left ankle and foot: Secondary | ICD-10-CM

## 2020-06-22 ENCOUNTER — Other Ambulatory Visit: Payer: Self-pay

## 2020-06-22 ENCOUNTER — Ambulatory Visit (INDEPENDENT_AMBULATORY_CARE_PROVIDER_SITE_OTHER): Payer: BC Managed Care – PPO | Admitting: Sports Medicine

## 2020-06-22 VITALS — BP 112/82 | Ht 70.08 in | Wt 182.0 lb

## 2020-06-22 DIAGNOSIS — M25511 Pain in right shoulder: Secondary | ICD-10-CM

## 2020-06-23 NOTE — Progress Notes (Signed)
   Subjective:    Patient ID: Ernest Griffin, male    DOB: 11-24-1976, 44 y.o.   MRN: [id number]  HPI chief complaint right shoulder pain  Patient comes in today complaining of 1 month of right shoulder pain.  Pain began acutely while using a shovel.  He states that while bringing the shovel down he felt an acute sharp pain in the right shoulder.  He has had persistent pain ever since.  Pain is most noticeable with picking up heavy objects or reaching overhead.  He has noticed weakness as well.  He is having difficulty sleeping on his right side at night.  No numbness or tingling.  No prior shoulder surgeries.  No neck pain.  History is obtained with the help of an interpreter  Interim medical history reviewed Medications reviewed Allergies reviewed    Review of Systems As above    Objective:   Physical Exam  Well-developed, well-nourished.  No acute distress.  Right shoulder: No tenderness to palpation.  No gross deformity.  No soft tissue swelling.  Patient has limited active range of motion with both forward flexion and abduction actively.  Passively he has full range of motion.  He has pain with both empty can and Hawkins testing.  His rotator cuff strength is 5/5 bilaterally but does reproduce pain with resisted supraspinatus and external rotation.  No pain with resisted subscapularis.  Equivocal O'Brien's.  Neurovascularly intact distally.      Assessment & Plan:   Right shoulder pain worrisome for rotator cuff tear  Patient's pain began acutely with an injury about a month ago.  He's endorsing weakness as well as nighttime pain which is suggestive of a possible rotator cuff tear.  I am going to get imaging in the form of both an x-ray and an MRI and the patient will follow up with me next week to discuss these results.  I provided him with an out of work note for the next 7 days.  We will delineate definitive treatment based on his MRI findings.

## 2020-06-29 ENCOUNTER — Encounter: Payer: Self-pay | Admitting: Orthopedic Surgery

## 2020-06-29 DIAGNOSIS — M25872 Other specified joint disorders, left ankle and foot: Secondary | ICD-10-CM

## 2020-06-29 MED ORDER — METHYLPREDNISOLONE ACETATE 40 MG/ML IJ SUSP
40.0000 mg | INTRAMUSCULAR | Status: AC | PRN
  Administered 2020-06-29: 40 mg via INTRA_ARTICULAR

## 2020-06-29 MED ORDER — LIDOCAINE HCL 1 % IJ SOLN
2.0000 mL | INTRAMUSCULAR | Status: AC | PRN
  Administered 2020-06-29: 2 mL

## 2020-06-29 NOTE — Progress Notes (Signed)
Office Visit Note   Patient: Ernest Griffin           Date of Birth: 1975-12-10           MRN: [id number] Visit Date: 06/14/2020              Requested by: Cain Saupe, MD [address] [place],  [place] [address] PCP: Cain Saupe, MD  Chief Complaint  Patient presents with  . Left Ankle - Pain      HPI: Patient is a 44 year old gentleman who was seen with an interpreter.  Patient complains of left ankle pain.  He had an injection in June which she states lasted for a month.  Patient states he now has pain anteriorly that radiates up his leg he states it is worse with weightbearing complains of cracking in the ankle he is currently working.  Assessment & Plan: Visit Diagnoses: No diagnosis found.  Plan:.  The left ankle was injected he tolerated this well discussed the risk and benefits of arthroscopic debridement of the ankle.  Patient states he would like to proceed with arthroscopic debridement.  Follow-Up Instructions: Return if symptoms worsen or fail to improve.   Ortho Exam  Patient is alert, oriented, no adenopathy, well-dressed, normal affect, normal respiratory effort. Examination patient has a good dorsiflexion of the ankle good pulses he is tender to palpation of the medial joint line the posterior tibial tendon is functioning well.  Imaging: No results found. No images are attached to the encounter.  Labs: Lab Results  Component Value Date   HGBA1C 5.60 01/17/2016     Lab Results  Component Value Date   ALBUMIN 4.1 08/30/2018   ALBUMIN 4.1 01/17/2016    No results found for: MG No results found for: VD25OH  No results found for: PREALBUMIN CBC EXTENDED Latest Ref Rng & Units 08/30/2018 01/17/2016  WBC 3.4 - 10.8 x10E3/uL 4.6 4.5  RBC 4.14 - 5.80 x10E6/uL 4.59 4.51  HGB 13.0 - 17.7 g/dL 62.5 63.8  HCT 93.7 - 34.2 % 40.3 39.5  PLT 150 - 450 x10E3/uL 223 253  NEUTROABS 1 - 7 x10E3/uL 1.9 -  LYMPHSABS 0 - 3 x10E3/uL 1.3 -     There is no  height or weight on file to calculate BMI.  Orders:  No orders of the defined types were placed in this encounter.  No orders of the defined types were placed in this encounter.    Procedures: Medium Joint Inj: L ankle on 06/29/2020 9:42 AM Indications: pain and diagnostic evaluation Details: 22 G 1.5 in needle Medications: 2 mL lidocaine 1 %; 40 mg methylPREDNISolone acetate 40 MG/ML Outcome: tolerated well, no immediate complications Procedure, treatment alternatives, risks and benefits explained, specific risks discussed. Consent was given by the patient. Immediately prior to procedure a time out was called to verify the correct patient, procedure, equipment, support staff and site/side marked as required. Patient was prepped and draped in the usual sterile fashion.      Clinical Data: No additional findings.  ROS:  All other systems negative, except as noted in the HPI. Review of Systems  Objective: Vital Signs: There were no vitals taken for this visit.  Specialty Comments:  No specialty comments available.  PMFS History: Patient Active Problem List   Diagnosis Date Noted  . Left ankle pain 02/12/2020  . Decreased vision of right eye 09/07/2016  . Allergic conjunctivitis of both eyes 09/07/2016  . Hoarseness 04/24/2016  . Borborygmi 04/24/2016  .  Rhinitis, allergic 04/24/2016  . Asthma 01/17/2016   Past Medical History:  Diagnosis Date  . Asthma     Family History  Problem Relation Age of Onset  . Healthy Father     History reviewed. No pertinent surgical history. Social History   Occupational History  . Occupation: [employer]   Tobacco Use  . Smoking status: Never Smoker  . Smokeless tobacco: Never Used  Vaping Use  . Vaping Use: Never used  Substance and Sexual Activity  . Alcohol use: No    Alcohol/week: 0.0 standard drinks  . Drug use: No  . Sexual activity: Yes

## 2020-07-06 ENCOUNTER — Ambulatory Visit: Payer: BC Managed Care – PPO | Admitting: Sports Medicine

## 2020-07-13 ENCOUNTER — Ambulatory Visit: Payer: BC Managed Care – PPO | Admitting: Family Medicine

## 2020-07-15 ENCOUNTER — Ambulatory Visit (INDEPENDENT_AMBULATORY_CARE_PROVIDER_SITE_OTHER): Payer: BC Managed Care – PPO | Admitting: Physician Assistant

## 2020-07-15 ENCOUNTER — Telehealth: Payer: Self-pay

## 2020-07-15 ENCOUNTER — Encounter: Payer: Self-pay | Admitting: Physician Assistant

## 2020-07-15 DIAGNOSIS — M25872 Other specified joint disorders, left ankle and foot: Secondary | ICD-10-CM | POA: Diagnosis not present

## 2020-07-15 NOTE — Telephone Encounter (Signed)
Pt wants to know which COVID vaccine he should get with his medical history. Please advise.

## 2020-07-15 NOTE — Progress Notes (Signed)
Office Visit Note   Patient: Ernest Griffin           Date of Birth: May 17, 1976           MRN: [id number] Visit Date: 07/15/2020              Requested by: Cain Saupe, MD [address] [place],  [place] [address] PCP: Cain Saupe, MD  Chief Complaint  Patient presents with  . Left Ankle - Pain      HPI: This is a pleasant gentleman with a history of left ankle impingement.  He has had 2 injections.  The first lasted a month the second lasted 2 weeks.  He had been offered by Dr. Lajoyce Corners the option of the left ankle arthroscopy and debridement.  He comes in today asking questions about the procedure and would like to move forward  Assessment & Plan: Visit Diagnoses: No diagnosis found.  Plan: Patient was seen today with the help of an interpreter.  We answered all his questions regarding recovery surgical outcomes and risks.  We will go forward and schedule the surgery  Follow-Up Instructions: No follow-ups on file.   Ortho Exam  Patient is alert, oriented, no adenopathy, well-dressed, normal affect, normal respiratory effort. Left ankle impingement findings especially over the medial side of the ankle.  No swelling no cellulitis.  Heart regular rate and rhythm lungs clear bilaterally Imaging: No results found. No images are attached to the encounter.  Labs: Lab Results  Component Value Date   HGBA1C 5.60 01/17/2016     Lab Results  Component Value Date   ALBUMIN 4.1 08/30/2018   ALBUMIN 4.1 01/17/2016    No results found for: MG No results found for: VD25OH  No results found for: PREALBUMIN CBC EXTENDED Latest Ref Rng & Units 08/30/2018 01/17/2016  WBC 3.4 - 10.8 x10E3/uL 4.6 4.5  RBC 4.14 - 5.80 x10E6/uL 4.59 4.51  HGB 13.0 - 17.7 g/dL 26.9 48.5  HCT 46.2 - 70.3 % 40.3 39.5  PLT 150 - 450 x10E3/uL 223 253  NEUTROABS 1 - 7 x10E3/uL 1.9 -  LYMPHSABS 0 - 3 x10E3/uL 1.3 -     There is no height or weight on file to calculate BMI.  Orders:  No orders of  the defined types were placed in this encounter.  No orders of the defined types were placed in this encounter.    Procedures: No procedures performed  Clinical Data: No additional findings.  ROS:  All other systems negative, except as noted in the HPI. Review of Systems  Objective: Vital Signs: There were no vitals taken for this visit.  Specialty Comments:  No specialty comments available.  PMFS History: Patient Active Problem List   Diagnosis Date Noted  . Left ankle pain 02/12/2020  . Decreased vision of right eye 09/07/2016  . Allergic conjunctivitis of both eyes 09/07/2016  . Hoarseness 04/24/2016  . Borborygmi 04/24/2016  . Rhinitis, allergic 04/24/2016  . Asthma 01/17/2016   Past Medical History:  Diagnosis Date  . Asthma     Family History  Problem Relation Age of Onset  . Healthy Father     No past surgical history on file. Social History   Occupational History  . Occupation: [employer]   Tobacco Use  . Smoking status: Never Smoker  . Smokeless tobacco: Never Used  Vaping Use  . Vaping Use: Never used  Substance and Sexual Activity  . Alcohol use: No    Alcohol/week: 0.0  standard drinks  . Drug use: No  . Sexual activity: Yes

## 2020-07-15 NOTE — Telephone Encounter (Signed)
Will route to PCP for review. 

## 2020-07-16 NOTE — Telephone Encounter (Signed)
Based on his medical history there is no preference for one vaccine over the other.  Whichever vaccine is available to him I would suggest he get.

## 2020-07-16 NOTE — Telephone Encounter (Signed)
Patient was called and a message was left informing him that he can take which ever vaccine is available to him.

## 2020-07-17 ENCOUNTER — Encounter (HOSPITAL_COMMUNITY): Payer: Self-pay

## 2020-07-17 ENCOUNTER — Ambulatory Visit
Admission: RE | Admit: 2020-07-17 | Discharge: 2020-07-17 | Disposition: A | Discharge status: Home / Self Care | Payer: BC Managed Care – PPO | Source: Ambulatory Visit | Attending: Sports Medicine | Admitting: Sports Medicine

## 2020-07-17 ENCOUNTER — Other Ambulatory Visit: Payer: Self-pay

## 2020-07-17 ENCOUNTER — Encounter: Payer: Self-pay | Admitting: Family Medicine

## 2020-07-17 ENCOUNTER — Ambulatory Visit (HOSPITAL_COMMUNITY)
Admission: EM | Admit: 2020-07-17 | Discharge: 2020-07-17 | Disposition: A | Discharge status: Home / Self Care | Payer: BC Managed Care – PPO | Attending: Urgent Care | Admitting: Urgent Care

## 2020-07-17 DIAGNOSIS — S46911A Strain of unspecified muscle, fascia and tendon at shoulder and upper arm level, right arm, initial encounter: Secondary | ICD-10-CM

## 2020-07-17 DIAGNOSIS — M25511 Pain in right shoulder: Secondary | ICD-10-CM

## 2020-07-17 MED ORDER — TRAMADOL HCL 50 MG PO TABS: 50.0000 mg | ORAL_TABLET | Freq: Four times a day (QID) | ORAL | 0 refills | Status: DC | PRN

## 2020-07-17 MED ORDER — TIZANIDINE HCL 4 MG PO TABS: 4.0000 mg | ORAL_TABLET | Freq: Three times a day (TID) | ORAL | 0 refills | Status: DC | PRN

## 2020-07-17 NOTE — Discharge Instructions (Addendum)
Please schedule Tylenol at 500 mg - 650 mg once every 6 hours as needed for aches and pains.  If you still have pain despite taking Tylenol regularly, this is breakthrough pain.  You can use tramadol once every 6 hours for this.  Once your pain is better controlled, switch back to just Tylenol.  It is okay to use the muscle relaxant together with this.  This is tizanidine and if it makes you sleepy just use it at bedtime.  Unfortunately our clinic cannot do FMLA and disability and therefore highly recommend that you follow-up with your primary care provider or the physician that is working with you on your shoulder pain, ordered your MRI.

## 2020-07-17 NOTE — ED Triage Notes (Signed)
Pt c/o 5/10 pain in right shoulder. Pt denies injury. Pt able to lift shoulder up to shoulder level, but not higher than that.

## 2020-07-17 NOTE — ED Provider Notes (Signed)
[facility]   MRN: [id number] DOB: 1976-04-11  Subjective:   Ernest Griffin is a 44 y.o. male presenting for ongoing right shoulder pain.  He is working with his orthopedist on this, got an MRI done today.  Has not followed up with him.  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  It hurts his shoulder every single time but he is required to do this for his work.  Denies fall, trauma otherwise.  Denies bony deformity, decreased range of motion.  States that he can move it but has severe sharp pains.  Has not used any medications for relief, was told not to given that ibuprofen causes some itching.  No current facility-administered medications for this encounter.  Current Outpatient Medications:  .  albuterol (VENTOLIN HFA) 108 (90 Base) MCG/ACT inhaler, Inhale 2 puffs into the lungs every 6 (six) hours as needed for wheezing or shortness of breath. Needs office visit, Disp: 18 g, Rfl: 0 .  azelastine (ASTELIN) 0.1 % nasal spray, Place 2 sprays into both nostrils 2 (two) times daily. Use in each nostril as directed, Disp: 30 mL, Rfl: 3 .  budesonide-formoterol (SYMBICORT) 160-4.5 MCG/ACT inhaler, Inhale 2 puffs into the lungs 2 (two) times daily., Disp: 1 Inhaler, Rfl: 1 .  fluticasone (FLONASE) 50 MCG/ACT nasal spray, Place 2 sprays into both nostrils 2 (two) times daily. Must have office visit for refills, Disp: 16 g, Rfl: 0 .  olopatadine (PATADAY) 0.1 % ophthalmic solution, Place 1 drop into both eyes 2 (two) times daily., Disp: 5 mL, Rfl: 4   Allergies  Allergen Reactions  . Caffeine   . Ibuprofen     Pruritis   . Strawberry (Diagnostic) Itching    Past Medical History:  Diagnosis Date  . Asthma      History reviewed. No pertinent surgical history.  Family History  Problem Relation Age of Onset  . Healthy Father     Social History   Tobacco Use  . Smoking status: Never Smoker  . Smokeless  tobacco: Never Used  Vaping Use  . Vaping Use: Never used  Substance Use Topics  . Alcohol use: No    Alcohol/week: 0.0 standard drinks  . Drug use: No    ROS   Objective:   Vitals: BP 110/69   Pulse 70   Temp 98.4 F (36.9 C) (Oral)   Resp 16   Ht 5\' 8"  (1.727 m)   Wt 182 lb (82.6 kg)   SpO2 100%   BMI 27.67 kg/m   Physical Exam Constitutional:      General: He is not in acute distress.    Appearance: Normal appearance. He is well-developed and normal weight. He is not ill-appearing, toxic-appearing or diaphoretic.  HENT:     Head: Normocephalic and atraumatic.     Right Ear: External ear normal.     Left Ear: External ear normal.     Nose: Nose normal.     Mouth/Throat:     Pharynx: Oropharynx is clear.  Eyes:     General: No scleral icterus.       Right eye: No discharge.        Left eye: No discharge.     Extraocular Movements: Extraocular movements intact.     Pupils: Pupils are equal, round, and reactive to light.  Cardiovascular:     Rate and Rhythm: Normal rate.  Pulmonary:  Effort: Pulmonary effort is normal.  Musculoskeletal:     Right shoulder: Tenderness (along deltoid and AC joint) and bony tenderness present. No swelling, deformity, effusion, laceration or crepitus. Decreased range of motion (slight decrease in ext and int rotation). Normal strength.     Cervical back: Normal range of motion.  Skin:    General: Skin is warm and dry.  Neurological:     Mental Status: He is alert and oriented to person, place, and time.  Psychiatric:        Mood and Affect: Mood normal.        Behavior: Behavior normal.        Thought Content: Thought content normal.        Judgment: Judgment normal.     Assessment and Plan :   PDMP not reviewed this encounter.  1. Acute pain of right shoulder   2. Strain of right shoulder, initial encounter     Suspect that patient is consistently straining his shoulder secondary to the nature of his work.  He is  unable to take NSAIDs and therefore will recommend he schedule Tylenol.  Use tizanidine as well.  Tramadol for breakthrough pain.  Recommended follow-up with his orthopedist or PCP.  Provided him with a note for work as requested. Counseled patient on potential for adverse effects with medications prescribed/recommended today, ER and return-to-clinic precautions discussed, patient verbalized understanding.    Wallis Bamberg, PA-C 07/17/20 1801

## 2020-07-20 ENCOUNTER — Ambulatory Visit (INDEPENDENT_AMBULATORY_CARE_PROVIDER_SITE_OTHER): Payer: BC Managed Care – PPO | Admitting: Sports Medicine

## 2020-07-20 ENCOUNTER — Other Ambulatory Visit: Payer: Self-pay

## 2020-07-20 VITALS — BP 120/82 | Ht 69.69 in | Wt 182.0 lb

## 2020-07-20 DIAGNOSIS — M25511 Pain in right shoulder: Secondary | ICD-10-CM | POA: Diagnosis not present

## 2020-07-21 NOTE — Progress Notes (Signed)
   Subjective:    Patient ID: Ernest Griffin, male    DOB: [birth year], 44 y.o.   MRN: [id number]  HPI   Patient comes in today at my request to discuss recent MRI findings of the right shoulder.  MRI shows mild tendinosis of the supraspinatus tendon with a small insertional interstitial tear and subcortical reactive marrow edema.  He continues to have pain with overhead motion.  Also continues to endorse nighttime pain.  He has pain with lifting heavy objects as well.  Please note that history was obtained with the help of an interpreter.    Review of Systems    As above Objective:   Physical Exam  Well-developed, well-nourished.  No acute distress  Right shoulder: Patient has good shoulder range of motion with a positive painful arc.  Positive empty can, positive Hawkins.  His rotator cuff strength is 4+/5 with resisted supraspinatus.  5/5 with resisted external rotation and internal rotation.  Neurovascularly intact distally.  MRI of the right shoulder as above      Assessment & Plan:   Right shoulder pain secondary to small insertional supraspinatus tear  I recommended conservative treatment to start.  I will refer him to physical therapy and I've given him a note restricting what he is able to lift and push at work.  He will return to the office in 4 weeks for reevaluation.  I did discuss possible subacromial cortisone injection but the patient decided to wait on that today.  If he continues to struggle at follow-up then I may consider referral to orthopedics to discuss other options.

## 2020-08-03 ENCOUNTER — Ambulatory Visit (INDEPENDENT_AMBULATORY_CARE_PROVIDER_SITE_OTHER): Payer: BC Managed Care – PPO | Admitting: Sports Medicine

## 2020-08-03 ENCOUNTER — Other Ambulatory Visit: Payer: Self-pay

## 2020-08-03 VITALS — BP 102/72 | Ht 69.0 in | Wt 182.0 lb

## 2020-08-03 DIAGNOSIS — M25511 Pain in right shoulder: Secondary | ICD-10-CM | POA: Diagnosis not present

## 2020-08-03 NOTE — Progress Notes (Signed)
   Subjective:    Patient ID: Ernest Griffin, male    DOB: 04/08/1976, 44 y.o.   MRN: [id number]  HPI   Patient comes in today for follow-up on a small tear in the right rotator cuff tendon.  He is about 50% better.  However, he has yet to start physical therapy.  Majority of his pain is at work when he is having to use his right arm.  He is requesting a letter today to give to his employer limiting the use of his right arm.  Please note that history was obtained with the help of an interpreter.    Review of Systems As above    Objective:   Physical Exam  Well-developed, well-nourished.  No acute distress.  Right shoulder: Patient has full range of motion but does have pain with internal rotation.  No tenderness to palpation.  Positive empty can, positive Hawkins.  Rotator cuff strength remains 5/5 but is reproducible of pain with resisted external rotation.  Neurovascularly intact distally.      Assessment & Plan:   Right shoulder pain secondary to small insertional interstitial supraspinatus tendon tear  I have given him a new note for work asking that they limit use of his right arm until follow-up with me in 4 weeks.  In the meantime, I have reiterated the importance of him starting physical therapy.  He understands.  Call with questions or concerns in the interim.

## 2020-08-17 ENCOUNTER — Other Ambulatory Visit: Payer: Self-pay

## 2020-08-17 ENCOUNTER — Ambulatory Visit: Payer: BC Managed Care – PPO | Attending: Sports Medicine

## 2020-08-17 DIAGNOSIS — G8929 Other chronic pain: Secondary | ICD-10-CM | POA: Insufficient documentation

## 2020-08-17 DIAGNOSIS — M25611 Stiffness of right shoulder, not elsewhere classified: Secondary | ICD-10-CM | POA: Insufficient documentation

## 2020-08-17 DIAGNOSIS — M25511 Pain in right shoulder: Secondary | ICD-10-CM | POA: Insufficient documentation

## 2020-08-17 DIAGNOSIS — M75111 Incomplete rotator cuff tear or rupture of right shoulder, not specified as traumatic: Secondary | ICD-10-CM | POA: Insufficient documentation

## 2020-08-18 NOTE — Therapy (Addendum)
[facility] [address] [place], [place], [address] Phone: [phone]   Fax:  [phone]  Physical Therapy Evaluation / Discharge  Patient Details  Name: Royston Bakel A Kuenzel MRN: [id number] Date of Birth: 07/19/1976 Referring Provider (PT): Ralene Corkraper, Timothy R, DO   Encounter Date: 08/17/2020   PT End of Session - 08/18/20 0725    Visit Number 1    Number of Visits 13    Authorization Type BCBS COMM PPO    Progress Note Due on Visit 10    PT Start Time 1140    PT Stop Time 1227    PT Time Calculation (min) 47 min    Activity Tolerance Patient tolerated treatment well    Behavior [facility] Therapy [facility] for tasks assessed/performed           Past Medical History:  Diagnosis Date  . Asthma     History reviewed. No pertinent surgical history.  There were no vitals filed for this visit.    Subjective Assessment - 08/18/20 0651    Subjective Pt reports a 3 month Hx of R shoulder pain. 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    How long can you sit comfortably? No issue    How long can you stand comfortably? No issue    How long can you walk comfortably? No issue    Diagnostic tests IMPRESSION:1. Mild tendinosis of the supraspinatus tendon with a smallinsertional interstitial tear and subcortical reactive marrow edema.2. Mild tendinosis of the infraspinatus tendon.3. Mild subacromial/subdeltoid bursitis.    Patient Stated Goals Not to have R shoulder pain and for the use of my R shoulder return to normal    Currently in Pain? Yes    Pain Score 5     Pain Location Shoulder    Pain Orientation Right;Posterior;Anterior;Lateral    Pain Descriptors / Indicators Aching;Sharp    Pain Type Chronic pain    Pain Radiating Towards NA    Pain Onset More than a month ago    Pain Frequency Intermittent    Aggravating Factors  Certain arm movements; At the end of work  day    Pain Relieving Factors Rest    Effect of Pain on Daily Activities Moderate impact              [facility] PT Assessment - 08/18/20 0001      Assessment   Medical Diagnosis Acute pain right shoulder;partial tear    Referring Provider (PT) Ralene Corkraper, Timothy R, DO    Onset Date/Surgical Date --   3 months   Hand Dominance Right    Next MD Visit 08/31/20    Prior Therapy No      Precautions   Precautions None      Restrictions   Weight Bearing Restrictions No      Balance Screen   Has the patient fallen in the past 6 months No      Home Environment   Living Environment Private residence    Living Arrangements Spouse/significant other;Children    Type of Home House    Home Access Level entry    Home Layout Two level    Alternate Level Stairs-Number of Steps 14    Alternate Level Stairs-Rails Right      Prior Function   Level of Independence Independent    Vocation Full time employment    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 Light duty to mainly use his L hand   [employer]     Cognition   Overall Cognitive Status Within Functional Limits for tasks assessed    Attention Focused    Focused Attention Appears intact    Memory Appears intact    Awareness Appears intact    Problem Solving Appears intact      Observation/Other Assessments   Focus on Therapeutic Outcomes (FOTO)  51% Limitation      Sensation   Light Touch Appears Intact      Posture/Postural Control   Posture/Postural Control Postural limitations    Postural Limitations Forward head;Rounded Shoulders      ROM / Strength   AROM / PROM / Strength AROM;PROM;Strength      AROM   AROM Assessment Site Shoulder    Right/Left Shoulder Right;Left    Right Shoulder Flexion 110 Degrees   pain near endrange   Right Shoulder ABduction 110 Degrees   pain near endrange   Left Shoulder Flexion 140 Degrees    Left Shoulder  ABduction 136 Degrees      PROM   PROM Assessment Site Shoulder    Right/Left Shoulder Right;Left    Right Shoulder Flexion 120 Degrees    Right Shoulder ABduction 120 Degrees      Strength   Strength Assessment Site Shoulder    Right/Left Shoulder Right    Right Shoulder Flexion 5/5   pain   Right Shoulder Extension 5/5    Right Shoulder ABduction 5/5   pain   Right Shoulder Internal Rotation 5/5    Right Shoulder External Rotation 5/5   pain     Palpation   Palpation comment TTP supraspintaus fossa; bicepital groove      Special Tests    Special Tests Biceps/Labral Tests;Rotator Cuff Impingement    Rotator Cuff Impingment tests Leanord Asal test;Empty Can test;Full Can test    Biceps/Labral tests Speeds Test      Hawkins-Kennedy test   Findings Negative    Side Right      Empty Can test   Findings Positive    Side Right    Comment pain s weakness      Full Can test   Findings Positive    Side Right    Comment Pain s weakness      Speeds test   findings Positive    Side Right    Comment pain      Transfers   Transfers Sit to Stand;Stand to Sit    Sit to Stand 7: Independent      Ambulation/Gait   Ambulation/Gait Yes    Ambulation/Gait Assistance 7: Independent    Gait Pattern Within Functional Limits;Step-through pattern                      Objective measurements completed on examination: See above findings.               PT Education - 08/18/20 0723    Education Details Eval findings, POC, HEP, cross friction massage to anterior shoulder, sleeping psosition to support R UE    Person(s) Educated Patient    Methods Explanation;Demonstration;Tactile cues;Verbal cues;Handout;Other (comment)    Comprehension Verbalized understanding;Returned demonstration;Verbal cues required;Tactile cues required;Need further instruction            PT Short Term Goals - 08/18/20 0745      PT SHORT TERM  GOAL #1   Title Review results of  FOTO    Time 2    Period Weeks    Status New    Target Date 09/01/20      PT SHORT TERM GOAL #2   Title Pt will be ind in an initial HEP    Baseline Started on eval    Time 3    Period Weeks    Status New    Target Date 09/08/20      PT SHORT TERM GOAL #3   Title Pt will voice understanding of measures for pain management and reduction    Time 3    Period Weeks    Status New    Target Date 09/08/20             PT Long Term Goals - 08/18/20 0748      PT LONG TERM GOAL #1   Title Pt will be Ind in a final HEP    Time 7    Period Weeks    Status New    Target Date 10/06/20      PT LONG TERM GOAL #2   Title Pts FOTO score will improve to 30% limitation    Baseline 51% limitation    Period Weeks    Status New    Target Date 10/06/20      PT LONG TERM GOAL #3   Title Pt will be able to lift 1lb to shoulder height with no difficulty    Baseline Some difficulty    Time 7    Period Weeks    Status New    Target Date 10/06/20      PT LONG TERM GOAL #4   Title Pt R shoulder AROM will improve to 120 for flexion and abduction    Baseline 110d    Time 7    Period Weeks    Status New    Target Date 10/06/20      PT LONG TERM GOAL #5   Title Pt will report improvement in R shoulder pain to 2/10 or less with daily and work activities    Baseline 5/10    Time 7    Period Weeks    Status New    Target Date 10/06/20                  Plan - 08/18/20 0721    Clinical Impression Statement Pt preresents to PT c chronic R shoulder pain. With eval R shoulder AROM was decreased and limited by pain near endrange, rotator cuff tests and bicepital tendon test were positive for pain R shoulder demonstrated 5/5 strength. Pt was TTP of the R supraspinatus fossa and R bicepital groove. Pt will benefit from PT 2w6 for strengthening and ROM of the R shoulder and measures for the reduction of pain to maximize functional use.    Personal Factors and Comorbidities Time since  onset of injury/illness/exacerbation;Profession    Examination-Activity Limitations Reach Overhead    Examination-Participation Restrictions Occupation    Stability/Clinical Decision Making Stable/Uncomplicated    Clinical Decision Making Low    Rehab Potential Good    PT Frequency 2x / week    PT Duration 6 weeks    PT Treatment/Interventions ADLs/Self Care Home Management;Cryotherapy;Electrical Stimulation;Iontophoresis 4mg /ml Dexamethasone;Moist Heat;Ultrasound;Therapeutic exercise;Therapeutic activities;Functional mobility training;Patient/family education;Manual techniques;Passive range of motion;Taping;Vasopneumatic Device    PT Next Visit Plan Assess response to HEP; progress GH and shoulder girdle strengthening as indicated; Review FOTO    PT  Home Exercise Plan [id number]: Scapular retraction c RTB    Consulted and Agree with Plan of Care Patient           Patient will benefit from skilled therapeutic intervention in order to improve the following deficits and impairments:  Decreased activity tolerance, Pain, Impaired UE functional use, Decreased range of motion  Visit Diagnosis: Chronic right shoulder pain  Incomplete tear of right rotator cuff, unspecified whether traumatic  Decreased ROM of right shoulder     Problem List Patient Active Problem List   Diagnosis Date Noted  . Left ankle pain 02/12/2020  . Decreased vision of right eye 09/07/2016  . Allergic conjunctivitis of both eyes 09/07/2016  . Hoarseness 04/24/2016  . Borborygmi 04/24/2016  . Rhinitis, allergic 04/24/2016  . Asthma 01/17/2016    Joellyn Rued MS, PT 08/18/20 7:57 AM  [facility] [address] [place], [place], [address] Phone: [phone]   Fax:  [phone]  Name: EVERSON MOTT MRN: [id number] Date of Birth: 11-29-1976    Pt discharged due to not returning since evaluation Kristoffer Leamon PT, DPT, LAT, ATC  01/27/21  9:22  AM

## 2020-08-24 ENCOUNTER — Ambulatory Visit: Payer: BC Managed Care – PPO | Admitting: Sports Medicine

## 2020-08-27 ENCOUNTER — Ambulatory Visit: Payer: BC Managed Care – PPO | Admitting: Physician Assistant

## 2020-08-30 ENCOUNTER — Ambulatory Visit: Payer: BC Managed Care – PPO

## 2020-09-01 ENCOUNTER — Ambulatory Visit: Payer: BC Managed Care – PPO | Admitting: Physician Assistant

## 2020-09-02 ENCOUNTER — Telehealth: Payer: Self-pay | Admitting: Physical Therapy

## 2020-09-02 ENCOUNTER — Ambulatory Visit: Payer: BC Managed Care – PPO

## 2020-09-02 NOTE — Telephone Encounter (Signed)
[employer] , world services to have them contact Ernest Griffin about missed PT appointments and that next appointment is on the 5th of October at 8:30 AM. Asked for a call to let us know if he will come in to next session or wants to be discharged.

## 2020-09-06 ENCOUNTER — Ambulatory Visit: Payer: BC Managed Care – PPO | Admitting: Orthopedic Surgery

## 2020-09-07 ENCOUNTER — Ambulatory Visit: Payer: BC Managed Care – PPO | Attending: Sports Medicine

## 2020-09-09 ENCOUNTER — Ambulatory Visit: Payer: BC Managed Care – PPO

## 2020-09-13 ENCOUNTER — Ambulatory Visit: Payer: BC Managed Care – PPO

## 2020-10-14 ENCOUNTER — Other Ambulatory Visit: Payer: Self-pay | Admitting: Family Medicine

## 2020-10-14 MED ORDER — OLOPATADINE HCL 0.1 % OP SOLN: 1.0000 [drp] | Freq: Two times a day (BID) | OPHTHALMIC | 4 refills | Status: DC

## 2020-10-14 NOTE — Telephone Encounter (Signed)
Requested Prescriptions  Pending Prescriptions Disp Refills   olopatadine (PATADAY) 0.1 % ophthalmic solution 5 mL 4    Sig: Place 1 drop into both eyes 2 (two) times daily.     Ophthalmology:  Quincy Carnes - 10/14/2020 11:53 AM      Passed - Valid encounter within last 12 months    Recent Outpatient Visits          9 months ago Moderate persistent asthma with acute exacerbation   [facility] Storm Frisk, MD   1 year ago Moderate persistent asthma without complication   [facility] Fulp, [place], MD   2 years ago Mild intermittent asthma without complication   [facility] Fulp, [place], MD   4 years ago Decreased vision of right eye   [facility] [place], [place], MD   4 years ago Allergic rhinitis, unspecified allergic rhinitis type   [facility] Dessa Phi, MD

## 2020-10-14 NOTE — Telephone Encounter (Signed)
Requested medication (s) are due for refill today: Cetirizine, yes  Requested medication (s) are on the active medication list: no  Last refill: ?  Future visit scheduled: no  Notes to clinic: not on active med list     Medication Refill - Medication: Cetirizine, Olopatadine,   Has the patient contacted their pharmacy? Yes.  Pt states that he is out of medication. Please advise . (Agent: If no, request that the patient contact the pharmacy for the refill.) (Agent: If yes, when and what did the pharmacy advise?)  Preferred Pharmacy (with phone number or street name):  [id number] [place], [place] - [address] AT [facility] [address] [facility] [address]  [address] [place] [address]  Phone: [phone] Fax: [phone]  Hours: Not open 24 hours    Agent: Please be advised that RX refills may take up to 3 business days. We ask that you follow-up with your pharmacy.

## 2020-10-14 NOTE — Telephone Encounter (Signed)
Medication Refill - Medication: Cetirizine, Olopatadine,   Has the patient contacted their pharmacy? Yes.  Pt states that he is out of medication. Please advise . (Agent: If no, request that the patient contact the pharmacy for the refill.) (Agent: If yes, when and what did the pharmacy advise?)  Preferred Pharmacy (with phone number or street name):  [id number] G[place] Otto, [place] - [address] AT [facility] [address] & [facility] [address]  [address] [place] [address]  Phone: [phone] Fax: [phone]  Hours: Not open 24 hours    Agent: Please be advised that RX refills may take up to 3 business days. We ask that you follow-up with your pharmacy.

## 2021-01-17 ENCOUNTER — Telehealth: Payer: Self-pay | Admitting: Family Medicine

## 2021-01-17 NOTE — Telephone Encounter (Signed)
Pt is needing an appt with any available provider

## 2021-01-17 NOTE — Telephone Encounter (Signed)
Pt ask if he could make an appt asap for his allergies and his asthma.  He use to see Cammie Fulp.  He also wanted a rx for Zyrtec 10 mg's.  He did not want to purchase OTC.  CB#  [phone]

## 2021-01-17 NOTE — Telephone Encounter (Signed)
[facility] [employer]   Pt called saying he needs a new rx for Zyrtec 10 mg

## 2021-02-04 NOTE — Telephone Encounter (Signed)
Called Pt with interpreter services. Pt did not answer and a VM was left for him to call [phone] to schedule appt.

## 2021-02-22 ENCOUNTER — Telehealth: Payer: Self-pay

## 2021-02-22 NOTE — Telephone Encounter (Signed)
Copied from CRM [id number]. Topic: General - Other >> Feb 21, 2021  4:18 PM Gwenlyn Fudge wrote: Reason for CRM: Pt called stating that [employer] sent over a fax for the pts FMLA paperwork for his asthma and allergies on 01/20/21. He states that they still have not received a response. Please advise. >> Feb 21, 2021  4:22 PM Gwenlyn Fudge wrote: PT states that he would like to come in tomorrow to receive copies of this information. Please advise.    Per last encounter patient needs an appointment with any provider to re-est care. Called patient using interpreter services advising patient I was calling from [facility] in regards to scheduling an appointment. Advised patient to call [phone] to schedule.   If patient calls back please advise him that he needs an appointment to discuss FMLA paperwork.

## 2021-03-02 NOTE — Telephone Encounter (Signed)
Pt called and stated he received a call from [employer] and they are waiting for his Medical Certification from the PCP for his FMLA for pts (asthma and allergies) / Pt states that he can come by tomorrow around 1pm if office can have a print out/ please advise if this can be sent to [employer] asap or if pt needs to pick it up/

## 2021-03-03 NOTE — Telephone Encounter (Signed)
Contacted pt and made aware that we have not received any FMLA paperwork. Made pt aware that he will need to have an appt to re-establish. Mallory has scheduled the pt with Meredeth Ide on 4/5 at 1010.Marland Kitchen Pt is aware and doesn't have any questions or concerns

## 2021-03-08 ENCOUNTER — Ambulatory Visit: Payer: BC Managed Care – PPO | Attending: Nurse Practitioner | Admitting: Nurse Practitioner

## 2021-03-08 ENCOUNTER — Telehealth: Payer: Self-pay | Admitting: Nurse Practitioner

## 2021-03-08 ENCOUNTER — Other Ambulatory Visit: Payer: Self-pay

## 2021-03-08 NOTE — Telephone Encounter (Signed)
Pt called back stating he just missed his virtual visit call, Pt does actually speak english if you are unable to get an arabic interpreter, Pt is waiting for a call back.

## 2021-03-08 NOTE — Telephone Encounter (Signed)
Called with arabic interpreter and LVM to call the office back at [phone]

## 2021-03-08 NOTE — Telephone Encounter (Signed)
Spoke with Pt and go him schedule for a different day

## 2021-03-28 NOTE — Progress Notes (Signed)
Subjective:    Patient ID: Ernest Griffin, male    DOB: 1976-09-09, 45 y.o.   MRN: [id number]  01/05/20 This is a 45 year old Arabic male who comes in today for work in visit for increased irritation in the eyes nosebleeds and having failed oral antihistamines for conjunctivitis which is allergic in nature and also asthma care  The patient states he has episodes of wheezing and shortness of breath 3-4 times weekly he also notes he is having nightly and daily irritation in the eyes with redness in the eyes itchiness in the eyes the nose is difficult to breathe through and stays congested.  The patient denies headaches.  He works in a Field seismologist.  He states he also has digestive issues with eats hard-boiled eggs and eggplant  He denies having ever been tested for allergies.  He was to be on Symbicort and albuterol but is ran out of these medications.  He states antihistamines orally did not help.  See asthma assessment below  04/04/21 Not seen since February 2021 and previously seen only for asthma however now I am the patient's primary care provider as his former primary care provider is left the practice.  This patient states he continues to have severe allergic rhinitis conjunctivitis and breathing issues.  See asthma assessment below.  His right shoulder impingement syndrome however has improved.  He does complain of abdominal colitis type symptoms when eating certain beans and drinking milk.  He needs colon cancer screening and does have insurance for colonoscopy.  Note at the last visit we obtained a allergy RAST assay and he had multiple positive food and environmental allergies.  Immunoglobulin a level was quite elevated at 1200. Note the patient has received both of his COVID initial vaccine series with Pfizer but is need of a booster shot  Asthma He complains of chest tightness, difficulty breathing, frequent throat clearing, hoarse voice, shortness of breath and wheezing. There  is no cough, hemoptysis or sputum production. This is a chronic problem. The problem occurs every several days. The problem has been gradually worsening. Associated symptoms include dyspnea on exertion, ear congestion, ear pain, heartburn, nasal congestion, postnasal drip, rhinorrhea and sneezing. Pertinent negatives include no appetite change, chest pain, fever, headaches, malaise/fatigue, myalgias, orthopnea, PND, sore throat or trouble swallowing. Associated symptoms comments: Itchy ears nose eyes . His symptoms are aggravated by exposure to fumes, exposure to smoke, pollen, eating, URI and change in weather (odors, car fumes perfume ,  no mold  in home). His symptoms are alleviated by beta-agonist. He reports moderate improvement on treatment. His past medical history is significant for asthma.    Past Medical History:  Diagnosis Date  . Asthma      Family History  Problem Relation Age of Onset  . Healthy Father      Social History   Socioeconomic History  . Marital status: Married    Spouse name: Not on file  . Number of children: Not on file  . Years of education: Not on file  . Highest education level: Not on file  Occupational History  . Occupation: [employer]   Tobacco Use  . Smoking status: Never Smoker  . Smokeless tobacco: Never Used  Vaping Use  . Vaping Use: Never used  Substance and Sexual Activity  . Alcohol use: No    Alcohol/week: 0.0 standard drinks  . Drug use: No  . Sexual activity: Yes  Other Topics Concern  . Not on file  Social History Narrative   Works at [employer]    From [place]    Social Determinants of Corporate investment banker Strain: Not on [employer] Insecurity: Not on file  Transportation Needs: Not on file  Physical Activity: Not on file  Stress: Not on file  Social Connections: Not on file  Intimate Partner Violence: Not on file     Allergies  Allergen Reactions  . Caffeine   . Ibuprofen     Pruritis   . Strawberry  (Diagnostic) Itching     Outpatient Medications Prior to Visit  Medication Sig Dispense Refill  . albuterol (VENTOLIN HFA) 108 (90 Base) MCG/ACT inhaler Inhale 2 puffs into the lungs every 6 (six) hours as needed for wheezing or shortness of breath. Needs office visit 18 g 0  . azelastine (ASTELIN) 0.1 % nasal spray Place 2 sprays into both nostrils 2 (two) times daily. Use in each nostril as directed 30 mL 3  . budesonide-formoterol (SYMBICORT) 160-4.5 MCG/ACT inhaler Inhale 2 puffs into the lungs 2 (two) times daily. 1 Inhaler 1  . olopatadine (PATADAY) 0.1 % ophthalmic solution Place 1 drop into both eyes 2 (two) times daily. 5 mL 4  . fluticasone (FLONASE) 50 MCG/ACT nasal spray Place 2 sprays into both nostrils 2 (two) times daily. Must have office visit for refills 16 g 0  . tiZANidine (ZANAFLEX) 4 MG tablet Take 1 tablet (4 mg total) by mouth every 8 (eight) hours as needed. (Patient not taking: Reported on 04/04/2021) 30 tablet 0  . traMADol (ULTRAM) 50 MG tablet Take 1 tablet (50 mg total) by mouth every 6 (six) hours as needed for severe pain. (Patient not taking: Reported on 04/04/2021) 15 tablet 0   No facility-administered medications prior to visit.     Review of Systems  Constitutional: Negative for appetite change, fever and malaise/fatigue.  HENT: Positive for ear pain, hoarse voice, postnasal drip, rhinorrhea and sneezing. Negative for sore throat and trouble swallowing.   Respiratory: Positive for shortness of breath and wheezing. Negative for cough, hemoptysis and sputum production.   Cardiovascular: Positive for dyspnea on exertion. Negative for chest pain and PND.  Gastrointestinal: Positive for abdominal pain, diarrhea, heartburn and nausea. Negative for abdominal distention.  Genitourinary: Negative.   Musculoskeletal: Negative for myalgias.  Neurological: Negative for headaches.       Objective:   Physical Exam Vitals:   04/04/21 1357  BP: 126/78  Pulse: 79   Resp: 18  SpO2: 95%  Weight: 181 lb (82.1 kg)  Height: 5\' 10"  (1.778 m)    Gen: Pleasant, well-nourished, in no distress,  normal affect  ENT: No lesions,  mouth clear, decreased nasal turbinate edema no purulence seen no conjunctival irritation seen   neck: No JVD, no TMG, no carotid bruits  Lungs: No use of accessory muscles, no dullness to percussion, clear   cardiovascular: RRR, heart sounds normal, no murmur or gallops, no peripheral edema  Abdomen: soft and NT, no HSM,  BS normal  Musculoskeletal: No deformities, no cyanosis or clubbing  Neuro: alert, non focal  Skin: Warm, no lesions or rashes       Assessment & Plan:  I personally reviewed all images and lab data in the [facility] system as well as any outside material available during this office visit and agree with the  radiology impressions.   Asthma Severe persistent asthma with atopic features  Symbicort was refilled  As needed albuterol refilled  Referral to allergy  given  Rhinitis, allergic Multiple positive allergic factors including nuts shellfish wheat soy beans mold  Allergy referral sent  Allergic conjunctivitis of both eyes As per allergy assessment  Colitis Unspecified colitis worsened with legumes and milk  Referring to gastroenterology for colonoscopy and will have them assess for colitis as well   Jadrien was seen today for pulmonary consult .  Diagnoses and all orders for this visit:  Moderate persistent asthma with acute exacerbation -     budesonide-formoterol (SYMBICORT) 160-4.5 MCG/ACT inhaler; Inhale 2 puffs into the lungs 2 (two) times daily. -     Ambulatory referral to Allergy  Mild intermittent asthma without complication -     albuterol (VENTOLIN HFA) 108 (90 Base) MCG/ACT inhaler; Inhale 2 puffs into the lungs every 6 (six) hours as needed for wheezing or shortness of breath. Needs office visit  Colitis -     Ambulatory referral to Gastroenterology  Colon cancer  screening -     Ambulatory referral to Gastroenterology  Allergic conjunctivitis of both eyes -     Ambulatory referral to Allergy  Non-seasonal allergic rhinitis due to other allergic trigger  Other orders -     azelastine (ASTELIN) 0.1 % nasal spray; Place 2 sprays into both nostrils 2 (two) times daily. Use in each nostril as directed -     fluticasone (FLONASE) 50 MCG/ACT nasal spray; Place 2 sprays into both nostrils 2 (two) times daily. Must have office visit for refills -     olopatadine (PATADAY) 0.1 % ophthalmic solution; Place 1 drop into both eyes 2 (two) times daily. -     cetirizine (ZYRTEC) 10 MG tablet; Take 1 tablet (10 mg total) by mouth daily.   Colonoscopy referral was made

## 2021-04-04 ENCOUNTER — Ambulatory Visit: Payer: BC Managed Care – PPO | Attending: Critical Care Medicine | Admitting: Critical Care Medicine

## 2021-04-04 ENCOUNTER — Other Ambulatory Visit: Payer: Self-pay

## 2021-04-04 ENCOUNTER — Encounter: Payer: Self-pay | Admitting: Critical Care Medicine

## 2021-04-04 VITALS — BP 126/78 | HR 79 | Resp 18 | Ht 70.0 in | Wt 181.0 lb

## 2021-04-04 DIAGNOSIS — K529 Noninfective gastroenteritis and colitis, unspecified: Secondary | ICD-10-CM | POA: Diagnosis not present

## 2021-04-04 DIAGNOSIS — J4520 Mild intermittent asthma, uncomplicated: Secondary | ICD-10-CM | POA: Diagnosis not present

## 2021-04-04 DIAGNOSIS — Z1211 Encounter for screening for malignant neoplasm of colon: Secondary | ICD-10-CM

## 2021-04-04 DIAGNOSIS — H1013 Acute atopic conjunctivitis, bilateral: Secondary | ICD-10-CM

## 2021-04-04 DIAGNOSIS — J3089 Other allergic rhinitis: Secondary | ICD-10-CM

## 2021-04-04 DIAGNOSIS — J4541 Moderate persistent asthma with (acute) exacerbation: Secondary | ICD-10-CM

## 2021-04-04 MED ORDER — ALBUTEROL SULFATE HFA 108 (90 BASE) MCG/ACT IN AERS: 2.0000 | INHALATION_SPRAY | Freq: Four times a day (QID) | RESPIRATORY_TRACT | 3 refills | Status: DC | PRN

## 2021-04-04 MED ORDER — FLUTICASONE PROPIONATE 50 MCG/ACT NA SUSP: 2.0000 | Freq: Two times a day (BID) | NASAL | 1 refills | Status: DC

## 2021-04-04 MED ORDER — CETIRIZINE HCL 10 MG PO TABS
10.0000 mg | ORAL_TABLET | Freq: Every day | ORAL | 11 refills | Status: DC
Start: 2021-04-04 — End: 2021-09-27

## 2021-04-04 MED ORDER — AZELASTINE HCL 0.1 % NA SOLN: 2.0000 | Freq: Two times a day (BID) | NASAL | 5 refills | Status: DC

## 2021-04-04 MED ORDER — OLOPATADINE HCL 0.1 % OP SOLN: 1.0000 [drp] | Freq: Two times a day (BID) | OPHTHALMIC | 4 refills | Status: DC

## 2021-04-04 MED ORDER — BUDESONIDE-FORMOTEROL FUMARATE 160-4.5 MCG/ACT IN AERO: 2.0000 | INHALATION_SPRAY | Freq: Two times a day (BID) | RESPIRATORY_TRACT | 6 refills | Status: DC

## 2021-04-04 NOTE — Assessment & Plan Note (Signed)
Multiple positive allergic factors including nuts shellfish wheat soy beans mold  Allergy referral sent

## 2021-04-04 NOTE — Assessment & Plan Note (Signed)
Severe persistent asthma with atopic features  Symbicort was refilled  As needed albuterol refilled  Referral to allergy given

## 2021-04-04 NOTE — Assessment & Plan Note (Signed)
Unspecified colitis worsened with legumes and milk  Referring to gastroenterology for colonoscopy and will have them assess for colitis as well

## 2021-04-04 NOTE — Assessment & Plan Note (Signed)
As per allergy assessment

## 2021-04-04 NOTE — Patient Instructions (Signed)
Refills on all your medications sent to your [facility] pharmacy  Stop Xyzal and begin cetirizine 1 daily  Referral to allergy will be made  Referral to gastroenterology is made for colitis and to get your colon cancer screening with colonoscopy  You will have to produce a new FMLA form and bring it to the office or have your company fax that we did not receive one previously  Return to see Dr. Delford Field 4 months  Please obtain your COVID booster shot below is where you can locate a booster shot by calling the number COVID-19 Vaccine Information can be found at: [email or web address] For questions related to vaccine distribution or appointments, please email [email or web address] or call [phone].            [facility]     Xyzal   1                               FMLA                .  4       COVID                  COVID-19 : [email or web address]             [email or web address]   [phone]. 'iieadat taebiat jamie al'adwiat alkhasat bik almursalat 'iilaa saydaliat [facility] alkhasat bik 'uwqif Xyzal wabda alsiytrizin 1 ywmyan sayatimu 'iijra' al'iihalat 'iilaa alhasasia tatimu al'iihalat 'iilaa tibi aljihaz alhadmii min 'ajl altihab alqawlun wali'iijra' fahs saratan alqawlun biaistikhdam tanzir alqawlun sayataeayan ealayk taqdim namudhaj FMLA jadid wa'iihdaruh 'iilaa almaktab 'aw alhusul ealaa fakis lisharikatik lam  nastalimh msbqan Geneticist, molecular. rayt 4 shuhur yurjaa alhusul ealaa laqtat COVID almueazizat 'adnah hayth [place] tahdid [facility] allaqtat almueazazat ean tariq alaitisal bialraqm yumkin aleuthur ealaa maelumat laqah COVID-19 ealaa: [email or web address] lil'asyilat almutaealiqat bitawzie alliqah 'aw almawaeid , yurjaa 'iirsal brid 'iiliktruniin 'iilaa [email or web address] 'aw aitasal [phone].

## 2021-04-14 ENCOUNTER — Telehealth: Payer: Self-pay

## 2021-04-14 NOTE — Telephone Encounter (Signed)
Patient FMLA paperwork has been faxed to his employer.

## 2021-06-01 NOTE — Progress Notes (Deleted)
New Patient Note  RE: Ernest Griffin A Himmelberger MRN: [id number] DOB: 09/05/1976 Date of Office Visit: 06/02/2021  Consult requested by: Storm FriskWright, Patrick E, MD Primary care provider: Storm FriskWright, Patrick E, MD  Chief Complaint: No chief complaint on file.  History of Present Illness: I had the pleasure of seeing Charles Pfund for initial evaluation at the [facility] on 06/01/2021. He is a 45 y.o. male, who is referred here by Storm FriskWright, Patrick E, MD for the evaluation of asthma, allergic rhinitis, food allergies.  Asthma: He reports symptoms of *** chest tightness, shortness of breath, coughing, wheezing, nocturnal awakenings for *** years. Current medications include *** which help. He reports *** using aerochamber with inhalers. He tried the following inhalers: ***. Main triggers are ***allergies, infections, weather changes, smoke, exercise, pet exposure. In the last month, frequency of symptoms: ***x/week. Frequency of nocturnal symptoms: ***x/month. Frequency of SABA use: ***x/week. Interference with physical activity: ***. Sleep is ***disturbed. In the last 12 months, emergency room visits/urgent care visits/doctor office visits or hospitalizations due to respiratory issues: ***. In the last 12 months, oral steroids courses: ***. Lifetime history of hospitalization for respiratory issues: ***. Prior intubations: ***. Asthma was diagnosed at age *** by ***. History of pneumonia: ***. He was evaluated by allergist ***pulmonologist in the past. Smoking exposure: ***. Up to date with flu vaccine: ***. Up to date with pneumonia vaccine: ***. Up to date with COVID-19 vaccine: ***. Prior Covid-19 infection: ***. History of reflux: ***.  Rhinitis: He reports symptoms of ***. Symptoms have been going on for *** years. The symptoms are present *** all year around with worsening in ***. Other triggers include exposure to ***. Anosmia: ***. Headache: ***. He has used *** with ***fair improvement in symptoms.  Sinus infections: ***. Previous work up includes: ***. Previous ENT evaluation: ***. Previous sinus imaging: ***. History of nasal polyps: ***. Last eye exam: ***. History of reflux: ***.  Food: He reports food allergy to ***. The reaction occurred at the age of ***, after he ate *** amount of ***. Symptoms started within *** and was in the form of *** hives, swelling, wheezing, abdominal pain, diarrhea, vomiting. ***Denies any associated cofactors such as exertion, infection, NSAID use, or alcohol consumption. The symptoms lasted for ***. He was evaluated in ED and received ***. Since this episode, he does *** not report other accidental exposures to ***. He does *** not have access to epinephrine autoinjector and *** needed to use it.   Past work up includes: immunocap which showed *** and skin prick testing which showed ***.  Dietary History: patient has been eating other foods including ***milk, ***eggs, ***peanut, ***treenuts, ***sesame, ***shellfish, ***fish, ***soy, ***wheat, ***meats, ***fruits and ***vegetables.  He reports reading labels and avoiding *** in diet completely. He tolerates ***baked egg and baked milk products.   04/04/2021 PCP visit: "Not seen since February 2021 and previously seen only for asthma however now I am the patient's primary care provider as his former primary care provider is left the practice.  This patient states he continues to have severe allergic rhinitis conjunctivitis and breathing issues.  See asthma assessment below.  His right shoulder impingement syndrome however has improved.  He does complain of abdominal colitis type symptoms when eating certain beans and drinking milk.  He needs colon cancer screening and does have insurance for colonoscopy.  Note at the last visit we obtained a allergy RAST assay and he had multiple positive food and environmental  allergies.  Immunoglobulin a level was quite elevated at 1200. Note the patient has received both of  his COVID initial vaccine series with Pfizer but is need of a booster shot   Asthma He complains of chest tightness, difficulty breathing, frequent throat clearing, hoarse voice, shortness of breath and wheezing. There is no cough, hemoptysis or sputum production. This is a chronic problem. The problem occurs every several days. The problem has been gradually worsening. Associated symptoms include dyspnea on exertion, ear congestion, ear pain, heartburn, nasal congestion, postnasal drip, rhinorrhea and sneezing. Pertinent negatives include no appetite change, chest pain, fever, headaches, malaise/fatigue, myalgias, orthopnea, PND, sore throat or trouble swallowing. Associated symptoms comments: Itchy ears nose eyes . His symptoms are aggravated by exposure to fumes, exposure to smoke, pollen, eating, URI and change in weather (odors, car fumes perfume ,  no mold  in home). His symptoms are alleviated by beta-agonist. He reports moderate improvement on treatment. His past medical history is significant for asthma. "  Component     Latest Ref Rng & Units 01/05/2020  Egg White IgE     Class I kU/L 0.51 (A)  Peanut IgE     Class I kU/L 0.37 (A)  Soybean IgE     Class 0/I kU/L 0.21 (A)  Milk IgE     Class 0 kU/L <0.10  Clam IgE     Class V kU/L 20.70 (A)  Shrimp IgE     Class VI kU/L >100 (A)  Walnut IgE     Class 0/I kU/L 0.21 (A)  Codfish IgE     Class II kU/L 0.76 (A)  Scallop IgE     Class V kU/L 35.80 (A)  Wheat IgE     Class 0/I kU/L 0.15 (A)  Allergen Corn, IgE     Class 0/I kU/L 0.20 (A)  Sesame Seed IgE     Class II kU/L 0.79 (A)   Component     Latest Ref Rng & Units 01/05/2020  IgE (Immunoglobulin E), Serum     6 - 495 IU/mL 1,248 (H)   Component     Latest Ref Rng & Units 01/05/2020  Class Description Allergens      Comment  Penicillium Chrysogen IgE     Class 0 kU/L <0.10  Cladosporium Herbarum IgE     Class 0/I kU/L 0.13 (A)  Aspergillus Fumigatus IgE     Class II  kU/L 1.01 (A)  Mucor Racemosus IgE     Class 0/I kU/L 0.13 (A)  Candida Albicans IgE     Class I kU/L 0.34 (A)  Alternaria Alternata IgE     Class 0 kU/L <0.10  Setomelanomma Rostrat     Class II kU/L 0.67 (A)  M009-IgE Fusarium proliferatum     Class III kU/L 1.80 (A)  Aureobasidi Pullulans IgE     Class 0/I kU/L 0.20 (A)  Phoma Betae IgE     Class 0/I kU/L 0.12 (A)  M014-IgE Epicoccum purpur     Class II kU/L 0.64 (A)  Stemphylium Herbarum IgE     Class 0/I kU/L 0.16 (A)    Assessment and Plan: Koen is a 45 y.o. male with: No problem-specific Assessment & Plan notes found for this encounter.  No follow-ups on file.  No orders of the defined types were placed in this encounter.  Lab Orders  No laboratory test(s) ordered today    Other allergy screening: Asthma: {Blank single:19197::"yes","no"} Rhino conjunctivitis: {Blank single:19197::"yes","no"} Food allergy: {Blank  single:19197::"yes","no"} Medication allergy: {Blank single:19197::"yes","no"} Hymenoptera allergy: {Blank single:19197::"yes","no"} Urticaria: {Blank single:19197::"yes","no"} Eczema:{Blank single:19197::"yes","no"} History of recurrent infections suggestive of immunodeficency: {Blank single:19197::"yes","no"}  Diagnostics: Spirometry:  Tracings reviewed. His effort: {Blank single:19197::"Good reproducible efforts.","It was hard to get consistent efforts and there is a question as to whether this reflects a maximal maneuver.","Poor effort, data can not be interpreted."} FVC: ***L FEV1: ***L, ***% predicted FEV1/FVC ratio: ***% Interpretation: {Blank single:19197::"Spirometry consistent with mild obstructive disease","Spirometry consistent with moderate obstructive disease","Spirometry consistent with severe obstructive disease","Spirometry consistent with possible restrictive disease","Spirometry consistent with mixed obstructive and restrictive disease","Spirometry uninterpretable due to  technique","Spirometry consistent with normal pattern","No overt abnormalities noted given today's efforts"}.  Please see scanned spirometry results for details.  Skin Testing: {Blank single:19197::"Select foods","Environmental allergy panel","Environmental allergy panel and select foods","Food allergy panel","None","Deferred due to recent antihistamines use"}. Positive test to: ***. Negative test to: ***.  Results discussed with patient/family.   Past Medical History: Patient Active Problem List   Diagnosis Date Noted   Colitis 04/04/2021   Decreased vision of right eye 09/07/2016   Allergic conjunctivitis of both eyes 09/07/2016   Hoarseness 04/24/2016   Rhinitis, allergic 04/24/2016   Asthma 01/17/2016   Past Medical History:  Diagnosis Date   Asthma    Past Surgical History: No past surgical history on file. Medication List:  Current Outpatient Medications  Medication Sig Dispense Refill   albuterol (VENTOLIN HFA) 108 (90 Base) MCG/ACT inhaler Inhale 2 puffs into the lungs every 6 (six) hours as needed for wheezing or shortness of breath. Needs office visit 18 g 3   azelastine (ASTELIN) 0.1 % nasal spray Place 2 sprays into both nostrils 2 (two) times daily. Use in each nostril as directed 30 mL 5   budesonide-formoterol (SYMBICORT) 160-4.5 MCG/ACT inhaler Inhale 2 puffs into the lungs 2 (two) times daily. 1 each 6   cetirizine (ZYRTEC) 10 MG tablet Take 1 tablet (10 mg total) by mouth daily. 30 tablet 11   fluticasone (FLONASE) 50 MCG/ACT nasal spray Place 2 sprays into both nostrils 2 (two) times daily. Must have office visit for refills 16 g 1   olopatadine (PATADAY) 0.1 % ophthalmic solution Place 1 drop into both eyes 2 (two) times daily. 5 mL 4   No current facility-administered medications for this visit.   Allergies: Allergies  Allergen Reactions   Caffeine    Ibuprofen     Pruritis    Strawberry (Diagnostic) Itching   Social History: Social History    Socioeconomic History   Marital status: Married    Spouse name: Not on file   Number of children: Not on file   Years of education: Not on file   Highest education level: Not on file  Occupational History   Occupation: [employer]   Tobacco Use   Smoking status: Never   Smokeless tobacco: Never  Vaping Use   Vaping Use: Never used  Substance and Sexual Activity   Alcohol use: No    Alcohol/week: 0.0 standard drinks   Drug use: No   Sexual activity: Yes  Other Topics Concern   Not on file  Social History Narrative   Works at [employer]    From [place]    Social Determinants of Corporate investment banker Strain: Not on file  Food Insecurity: Not on file  Transportation Needs: Not on file  Physical Activity: Not on file  Stress: Not on file  Social Connections: Not on file   Lives in a ***. Smoking: ***  Occupation: ***  Environmental HistorySurveyor, minerals in the house: Copywriter, advertising in the family room: {Blank single:19197::"yes","no"} Carpet in the bedroom: {Blank single:19197::"yes","no"} Heating: {Blank single:19197::"electric","gas","heat pump"} Cooling: {Blank single:19197::"central","window","heat pump"} Pet: {Blank single:19197::"yes ***","no"}  Family History: Family History  Problem Relation Age of Onset   Healthy Father    Problem                               Relation Asthma                                   *** Eczema                                *** Food allergy                          *** Allergic rhino conjunctivitis     ***  Review of Systems  Constitutional:  Negative for appetite change, chills, fever and unexpected weight change.  HENT:  Negative for congestion and rhinorrhea.   Eyes:  Negative for itching.  Respiratory:  Negative for cough, chest tightness, shortness of breath and wheezing.   Cardiovascular:  Negative for chest pain.  Gastrointestinal:  Negative for abdominal pain.   Genitourinary:  Negative for difficulty urinating.  Skin:  Negative for rash.  Neurological:  Negative for headaches.   Objective: There were no vitals taken for this visit. There is no height or weight on file to calculate BMI. Physical Exam Vitals and nursing note reviewed.  Constitutional:      Appearance: Normal appearance. He is well-developed.  HENT:     Head: Normocephalic and atraumatic.     Right Ear: External ear normal.     Left Ear: External ear normal.     Nose: Nose normal.     Mouth/Throat:     Mouth: Mucous membranes are moist.     Pharynx: Oropharynx is clear.  Eyes:     Conjunctiva/sclera: Conjunctivae normal.  Cardiovascular:     Rate and Rhythm: Normal rate and regular rhythm.     Heart sounds: Normal heart sounds. No murmur heard.   No friction rub. No gallop.  Pulmonary:     Effort: Pulmonary effort is normal.     Breath sounds: Normal breath sounds. No wheezing, rhonchi or rales.  Abdominal:     Palpations: Abdomen is soft.  Musculoskeletal:     Cervical back: Neck supple.  Skin:    General: Skin is warm.     Findings: No rash.  Neurological:     Mental Status: He is alert and oriented to person, place, and time.  Psychiatric:        Behavior: Behavior normal.   The plan was reviewed with the patient/family, and all questions/concerned were addressed.  It was my pleasure to see Rashad today and participate in his care. Please feel free to contact me with any questions or concerns.  Sincerely,  Wyline Mood, DO Allergy & Immunology  [facility] office: [phone] [facility] office: [phone]

## 2021-06-02 ENCOUNTER — Ambulatory Visit: Payer: BC Managed Care – PPO | Admitting: Allergy

## 2021-09-01 ENCOUNTER — Encounter (HOSPITAL_COMMUNITY): Payer: Self-pay

## 2021-09-01 ENCOUNTER — Ambulatory Visit (HOSPITAL_COMMUNITY)
Admission: EM | Admit: 2021-09-01 | Discharge: 2021-09-01 | Disposition: A | Discharge status: Home / Self Care | Payer: BC Managed Care – PPO | Attending: Student | Admitting: Student

## 2021-09-01 ENCOUNTER — Other Ambulatory Visit: Payer: Self-pay

## 2021-09-01 DIAGNOSIS — K2900 Acute gastritis without bleeding: Secondary | ICD-10-CM

## 2021-09-01 MED ORDER — ONDANSETRON 8 MG PO TBDP
8.0000 mg | ORAL_TABLET | Freq: Three times a day (TID) | ORAL | 0 refills | Status: DC | PRN
Start: 2021-09-01 — End: 2021-10-11

## 2021-09-01 MED ORDER — FAMOTIDINE 20 MG PO TABS
20.0000 mg | ORAL_TABLET | Freq: Two times a day (BID) | ORAL | 2 refills | Status: DC
Start: 2021-09-01 — End: 2021-12-07

## 2021-09-01 MED ORDER — OMEPRAZOLE 20 MG PO CPDR
20.0000 mg | DELAYED_RELEASE_CAPSULE | Freq: Every day | ORAL | 0 refills | Status: DC
Start: 2021-09-01 — End: 2021-12-07

## 2021-09-01 NOTE — Discharge Instructions (Addendum)
-  Pepcid (famotidine) taken with breakfast or dinner daily. Try for one week. You can increase to two pills daily if needed (breakfast and dinner). -If this medication is helping, you can continue it for longer.  -Omeprazole once daily x14 days.  -Take the Zofran (ondansetron) up to 3 times daily for nausea and vomiting. Dissolve one pill under your tongue or between your teeth and your cheek. -Limit spicy foods, alcohol, ibuprofen. -Drink plenty of water

## 2021-09-01 NOTE — ED Triage Notes (Signed)
Pt presents with c/o not being able to eat. Pt states he has been feeling nauseas and denies V/D. Pt states if he drinks water he feels nauseas. Pt states he has not been feeling hungry for the pas few days.

## 2021-09-01 NOTE — ED Provider Notes (Signed)
[facility]    CSN: [id number] Arrival date & time: 09/01/21  1830      History   Chief Complaint Chief Complaint  Patient presents with   Decreased Appetite    HPI Ernest Griffin is a 45 y.o. male presenting with decreased appetite for few days.  Medical history of colitis, controlled on diet.  Describes nausea without vomiting, worse with eating.  Denies abdominal pain.  Denies diarrhea, cough, fever/chills.  States he did take 4 mg of ibuprofen recently, but denies regular ibuprofen/NSAID, alcohol, spicy food.  HPI  Past Medical History:  Diagnosis Date   Asthma     Patient Active Problem List   Diagnosis Date Noted   Colitis 04/04/2021   Decreased vision of right eye 09/07/2016   Allergic conjunctivitis of both eyes 09/07/2016   Hoarseness 04/24/2016   Rhinitis, allergic 04/24/2016   Asthma 01/17/2016    History reviewed. No pertinent surgical history.     Home Medications    Prior to Admission medications   Medication Sig Start Date End Date Taking? Authorizing Provider  famotidine (PEPCID) 20 MG tablet Take 1 tablet (20 mg total) by mouth 2 (two) times daily. Take 1 pill with breakfast and 1 pill with dinner for 1 week.  After this, you can take as needed up to twice daily with meals. 09/01/21  Yes Rhys Martini, PA-C  omeprazole (PRILOSEC) 20 MG capsule Take 1 capsule (20 mg total) by mouth daily. 09/01/21  Yes Rhys Martini, PA-C  ondansetron (ZOFRAN ODT) 8 MG disintegrating tablet Take 1 tablet (8 mg total) by mouth every 8 (eight) hours as needed for nausea or vomiting. 09/01/21  Yes Rhys Martini, PA-C  albuterol (VENTOLIN HFA) 108 (90 Base) MCG/ACT inhaler Inhale 2 puffs into the lungs every 6 (six) hours as needed for wheezing or shortness of breath. Needs office visit 04/04/21   Storm Frisk, MD  azelastine (ASTELIN) 0.1 % nasal spray Place 2 sprays into both nostrils 2 (two) times daily. Use in each nostril as directed 04/04/21   Storm Frisk, MD  budesonide-formoterol [facility]) 160-4.5 MCG/ACT inhaler Inhale 2 puffs into the lungs 2 (two) times daily. 04/04/21   Storm Frisk, MD  cetirizine (ZYRTEC) 10 MG tablet Take 1 tablet (10 mg total) by mouth daily. 04/04/21   Storm Frisk, MD  fluticasone (FLONASE) 50 MCG/ACT nasal spray Place 2 sprays into both nostrils 2 (two) times daily. Must have office visit for refills 04/04/21 05/04/21  Storm Frisk, MD  olopatadine (PATADAY) 0.1 % ophthalmic solution Place 1 drop into both eyes 2 (two) times daily. 04/04/21   Storm Frisk, MD  levocetirizine (XYZAL) 5 MG tablet Take 1 tablet (5 mg total) by mouth every evening. To decrease allergy symptoms 04/02/19 06/16/19  Cain Saupe, MD    Family History Family History  Problem Relation Age of Onset   Healthy Father     Social History Social History   Tobacco Use   Smoking status: Never   Smokeless tobacco: Never  Vaping Use   Vaping Use: Never used  Substance Use Topics   Alcohol use: No    Alcohol/week: 0.0 standard drinks   Drug use: No     Allergies   Caffeine, Ibuprofen, and Strawberry (diagnostic)   Review of Systems Review of Systems  Constitutional:  Negative for appetite change, chills, diaphoresis, fever and unexpected weight change.  HENT:  Negative for congestion, ear pain, sinus pressure,  sinus pain, sneezing, sore throat and trouble swallowing.   Respiratory:  Negative for cough, chest tightness and shortness of breath.   Cardiovascular:  Negative for chest pain.  Gastrointestinal:  Positive for nausea. Negative for abdominal distention, abdominal pain, anal bleeding, blood in stool, constipation, diarrhea, rectal pain and vomiting.  Genitourinary:  Negative for dysuria, flank pain, frequency and urgency.  Musculoskeletal:  Negative for back pain and myalgias.  Neurological:  Negative for dizziness, light-headedness and headaches.  All other systems reviewed and are negative.   Physical  Exam Triage Vital Signs ED Triage Vitals  Enc Vitals Group     BP 09/01/21 1908 104/71     Pulse Rate 09/01/21 1908 70     Resp 09/01/21 1908 17     Temp 09/01/21 1908 98.3 F (36.8 C)     Temp Source 09/01/21 1908 Oral     SpO2 09/01/21 1908 100 %     Weight --      Height --      Head Circumference --      Peak Flow --      Pain Score 09/01/21 1906 0     Pain Loc --      Pain Edu? --      Excl. in GC? --    No data found.  Updated Vital Signs BP 104/71 (BP Location: Right Arm)   Pulse 70   Temp 98.3 F (36.8 C) (Oral)   Resp 17   SpO2 100%   Visual Acuity Right Eye Distance:   Left Eye Distance:   Bilateral Distance:    Right Eye Near:   Left Eye Near:    Bilateral Near:     Physical Exam Vitals reviewed.  Constitutional:      General: He is not in acute distress.    Appearance: Normal appearance. He is not ill-appearing.  HENT:     Head: Normocephalic and atraumatic.     Mouth/Throat:     Mouth: Mucous membranes are moist.     Comments: Moist mucous membranes Eyes:     Extraocular Movements: Extraocular movements intact.     Pupils: Pupils are equal, round, and reactive to light.  Cardiovascular:     Rate and Rhythm: Normal rate and regular rhythm.     Heart sounds: Normal heart sounds.  Pulmonary:     Effort: Pulmonary effort is normal.     Breath sounds: Normal breath sounds. No wheezing, rhonchi or rales.  Abdominal:     General: Bowel sounds are normal. There is no distension.     Palpations: Abdomen is soft. There is no mass.     Tenderness: There is no abdominal tenderness. There is no right CVA tenderness, left CVA tenderness, guarding or rebound.     Comments: No tenderness   Skin:    General: Skin is warm.     Capillary Refill: Capillary refill takes less than 2 seconds.     Comments: Good skin turgor  Neurological:     General: No focal deficit present.     Mental Status: He is alert and oriented to person, place, and time.   Psychiatric:        Mood and Affect: Mood normal.        Behavior: Behavior normal.     UC Treatments / Results  Labs (all labs ordered are listed, but only abnormal results are displayed) Labs Reviewed - No data to display  EKG   Radiology No results found.  Procedures Procedures (including critical care time)  Medications Ordered in UC Medications - No data to display  Initial Impression / Assessment and Plan / UC Course  I have reviewed the triage vital signs and the nursing notes.  Pertinent labs & imaging results that were available during my care of the patient were reviewed by me and considered in my medical decision making (see chart for details).     This patient is a very pleasant 45 y.o. year old male presenting with gastritis and nausea. Afebrile, nontachycardic, no reproducible abd pain or CVAT.  Trial of Pepcid, Prilosec, Zofran ODT.  Good hydration, brat diet.   ED return precautions discussed. Patient verbalizes understanding and agreement.   Declined language line as he speaks [place].  Final Clinical Impressions(s) / UC Diagnoses   Final diagnoses:  Acute gastritis without hemorrhage, unspecified gastritis type     Discharge Instructions      -Pepcid (famotidine) taken with breakfast or dinner daily. Try for one week. You can increase to two pills daily if needed (breakfast and dinner). -If this medication is helping, you can continue it for longer.  -Omeprazole once daily x14 days.  -Take the Zofran (ondansetron) up to 3 times daily for nausea and vomiting. Dissolve one pill under your tongue or between your teeth and your cheek. -Limit spicy foods, alcohol, ibuprofen. -Drink plenty of water    ED Prescriptions     Medication Sig Dispense Auth. Provider   ondansetron (ZOFRAN ODT) 8 MG disintegrating tablet Take 1 tablet (8 mg total) by mouth every 8 (eight) hours as needed for nausea or vomiting. 20 tablet Rhys Martini, PA-C    famotidine (PEPCID) 20 MG tablet Take 1 tablet (20 mg total) by mouth 2 (two) times daily. Take 1 pill with breakfast and 1 pill with dinner for 1 week.  After this, you can take as needed up to twice daily with meals. 30 tablet Rhys Martini, PA-C   omeprazole (PRILOSEC) 20 MG capsule Take 1 capsule (20 mg total) by mouth daily. 14 capsule Rhys Martini, PA-C      PDMP not reviewed this encounter.   Rhys Martini, PA-C 09/01/21 1935

## 2021-09-27 ENCOUNTER — Encounter: Payer: Self-pay | Admitting: Allergy

## 2021-09-27 ENCOUNTER — Other Ambulatory Visit: Payer: Self-pay

## 2021-09-27 ENCOUNTER — Ambulatory Visit (INDEPENDENT_AMBULATORY_CARE_PROVIDER_SITE_OTHER): Payer: BC Managed Care – PPO | Admitting: Allergy

## 2021-09-27 VITALS — BP 124/80 | HR 83 | Temp 96.7°F | Resp 18 | Ht 70.0 in | Wt 189.8 lb

## 2021-09-27 DIAGNOSIS — K219 Gastro-esophageal reflux disease without esophagitis: Secondary | ICD-10-CM | POA: Insufficient documentation

## 2021-09-27 DIAGNOSIS — J310 Chronic rhinitis: Secondary | ICD-10-CM | POA: Diagnosis not present

## 2021-09-27 DIAGNOSIS — T7800XD Anaphylactic reaction due to unspecified food, subsequent encounter: Secondary | ICD-10-CM | POA: Diagnosis not present

## 2021-09-27 DIAGNOSIS — T7800XA Anaphylactic reaction due to unspecified food, initial encounter: Secondary | ICD-10-CM | POA: Insufficient documentation

## 2021-09-27 DIAGNOSIS — J45909 Unspecified asthma, uncomplicated: Secondary | ICD-10-CM

## 2021-09-27 MED ORDER — CETIRIZINE HCL 10 MG PO TABS: 10.0000 mg | ORAL_TABLET | Freq: Every day | ORAL | 5 refills | Status: DC

## 2021-09-27 MED ORDER — EPINEPHRINE 0.3 MG/0.3ML IJ SOAJ: 0.3000 mg | INTRAMUSCULAR | 2 refills | Status: DC | PRN

## 2021-09-27 MED ORDER — AEROCHAMBER PLUS MISC: 2 refills | Status: DC

## 2021-09-27 MED ORDER — AZELASTINE HCL 0.1 % NA SOLN: 1.0000 | Freq: Two times a day (BID) | NASAL | 5 refills | Status: DC | PRN

## 2021-09-27 NOTE — Progress Notes (Signed)
New Patient Note  RE: Ernest Griffin MRN: [id number] DOB: 10-Oct-1976 Date of Office Visit: 09/27/2021  Consult requested by: Storm Frisk, MD Primary care provider: Storm Frisk, MD  Chief Complaint: Asthma (Wheezing and coughing allergies makes it worse - when he was 4 would get injections and use the inhaler for his asthma ) and Allergic Rhinitis  (No on any allergy medications )  History of Present Illness: I had the pleasure of seeing Ernest Griffin for initial evaluation at the [facility] on 09/27/2021. He is a 45 y.o. male, who is referred here by Storm Frisk, MD for the evaluation of asthma and allergic rhinitis. Arabic interpreter present in person.  Patient is not a good historian even with using an interpreter.   Asthma:  He reports symptoms of chest tightness, shortness of breath, wheezing for 40+ years. Current medications include Symbicort prn about 3-4 times per week which helps. He reports not using aerochamber with inhalers. He tried the following inhalers: none. Main triggers are allergies. In the last month, frequency of symptoms: 3-4x/week. Frequency of nocturnal symptoms: 0x/month. Frequency of SABA use: not sure. Interference with physical activity: no. Sleep is undisturbed. In the last 12 months, emergency room visits/urgent care visits/doctor office visits or hospitalizations due to respiratory issues: one. In the last 12 months, oral steroids courses: no. Lifetime history of hospitalization for respiratory issues: in [place]. Prior intubations: no. Asthma was diagnosed at age 5. History of pneumonia: no. He was not evaluated by allergist/pulmonologist in the past. Smoking exposure: no. Up to date with flu vaccine: no. Up to date with COVID-19 vaccine: yes. Prior Covid-19 infection: no. History of reflux: yes.  Rhinitis:  He reports symptoms of nasal congestion, rhinorrhea, sneezing, itchy eyes. Symptoms have been going on for 40+ years.  The symptoms are present all year around with worsening in weather changes. Other triggers include exposure to strong scents. Anosmia: no. Headache: yes. He has used zyrtec,  Flonase 2 sprays per nostril with minimal improvement in symptoms. Sinus infections: no. Previous work up includes: none. Previous ENT evaluation: no. Previous sinus imaging: no. History of nasal polyps: no. Last eye exam: not recently.  Component     Latest Ref Rng & Units 01/05/2020  Class Description Allergens      Comment  Penicillium Chrysogen IgE     Class 0 kU/L <0.10  Cladosporium Herbarum IgE     Class 0/I kU/L 0.13 (A)  Aspergillus Fumigatus IgE     Class II kU/L 1.01 (A)  Mucor Racemosus IgE     Class 0/I kU/L 0.13 (A)  Candida Albicans IgE     Class I kU/L 0.34 (A)  Alternaria Alternata IgE     Class 0 kU/L <0.10  Setomelanomma Rostrat     Class II kU/L 0.67 (A)  M009-IgE Fusarium proliferatum     Class III kU/L 1.80 (A)  Aureobasidi Pullulans IgE     Class 0/I kU/L 0.20 (A)  Phoma Betae IgE     Class 0/I kU/L 0.12 (A)  M014-IgE Epicoccum purpur     Class II kU/L 0.64 (A)  Stemphylium Herbarum IgE     Class 0/I kU/L 0.16 (A)   Component     Latest Ref Rng & Units 01/05/2020  IgE (Immunoglobulin E), Serum     6 - 495 IU/mL 1,248 (H)   Component     Latest Ref Rng & Units 01/05/2020  Egg White IgE  Class I kU/L 0.51 (A)  Peanut IgE     Class I kU/L 0.37 (A)  Soybean IgE     Class 0/I kU/L 0.21 (A)  Milk IgE     Class 0 kU/L <0.10  Clam IgE     Class V kU/L 20.70 (A)  Shrimp IgE     Class VI kU/L >100 (A)  Walnut IgE     Class 0/I kU/L 0.21 (A)  Codfish IgE     Class II kU/L 0.76 (A)  Scallop IgE     Class V kU/L 35.80 (A)  Wheat IgE     Class 0/I kU/L 0.15 (A)  Allergen Corn, IgE     Class 0/I kU/L 0.20 (A)  Sesame Seed IgE     Class II kU/L 0.79 (A)   Assessment and Plan: Ernest Griffin is a 45 y.o. male with: Not well controlled asthma without complication Diagnosed with asthma  since age 67. Currently only using Symbicort as needed about 3-4 times per week with good benefit. It makes him nauseous though. He does have reflux.  Today's spirometry showed: No overt abnormalities noted given today's efforts with no improvement in FEV1 post bronchodilator treatment. Clinically feeling improved.  Discussed the difference between rescue inhaler and maintenance inhaler - not clear if patient grasped the concept and will review again at next visit.  Daily controller medication(s): START Symbicort 2 puffs twice a day with spacer and rinse mouth afterwards with spacer and rinse mouth afterwards. If you are getting nauseous then let me know. Spacer prescription given and demonstrated proper use with inhaler. Patient understood technique and all questions/concerned were addressed.  May use albuterol rescue inhaler 2 puffs every 4 to 6 hours as needed for shortness of breath, chest tightness, coughing, and wheezing. May use albuterol rescue inhaler 2 puffs 5 to 15 minutes prior to strenuous physical activities. Monitor frequency of use.  Get spirometry at next visit.  Chronic rhinitis Perennial rhinoconjunctivitis symptoms for 40+ years with worsening in the better changes.  Tried Zyrtec, Flonase with minimal benefit.  No prior allergy/ENT evaluation. 2021 mold panel bloodwork was positive to select mold and IgE 1248.  Unable to skin test today as patient had to leave for work and he works 1 hour away. Use Flonase (fluticasone) nasal spray 1 spray per nostril twice a day as needed for nasal congestion.  Use azelastine nasal spray 1-2 sprays per nostril twice a day as needed for runny nose/drainage. Use over the counter antihistamines such as Zyrtec (cetirizine) daily as needed.  Return for skin testing. No zyrtec or azelastine nasal spray for 3 days before.  Anaphylactic shock due to adverse food reaction Shellfish, peanuts and tree nuts cause trouble breathing.  Eggplant and  strawberries cause rash.  2021 blood work was positive to clam, shrimp, scallop. Start strict avoidance of shellfish, peanuts, tree nuts, strawberries, eggplant I have prescribed epinephrine injectable device and demonstrated proper use. For mild symptoms you can take over the counter antihistamines such as Benadryl and monitor symptoms closely. If symptoms worsen or if you have severe symptoms including breathing issues, throat closure, significant swelling, whole body hives, severe diarrhea and vomiting, lightheadedness then inject epinephrine and seek immediate medical care afterwards. Emergency action plan given. Plan on skin testing to seafood, peanuts, tree nuts and strawberries at next visit.  Gastroesophageal reflux disease Patient states he is not taking any medications for this but on EMR he was supposed to be taking famotidine 20mg  BID and  omeprazole 20mg  daily.  Continue with lifestyle and dietary modifications. Follow up with PCP regarding the medications.  Return for Skin testing. Recommend eye exam.   Meds ordered this encounter  Medications   EPINEPHrine 0.3 mg/0.3 mL IJ SOAJ injection    Sig: Inject 0.3 mg into the muscle as needed for anaphylaxis.    Dispense:  1 each    Refill:  2   azelastine (ASTELIN) 0.1 % nasal spray    Sig: Place 1-2 sprays into both nostrils 2 (two) times daily as needed (nasal drainage). Use in each nostril as directed    Dispense:  30 mL    Refill:  5   cetirizine (ZYRTEC ALLERGY) 10 MG tablet    Sig: Take 1 tablet (10 mg total) by mouth daily.    Dispense:  30 tablet    Refill:  5   Spacer/Aero-Holding Chambers (AEROCHAMBER PLUS) inhaler    Sig: Use as instructed    Dispense:  1 each    Refill:  2    Lab Orders  No laboratory test(s) ordered today    Other allergy screening: Food allergy: yes Eggplant and strawberries cause some rash. Shellfish caused trouble breathing and caused rash.  Peanuts, tree nuts cause trouble breathing.    Dietary History: patient has been eating other foods including milk, eggs, sesame oil, tuna, wheat, meats, fruits and vegetables.  He reports reading labels and avoiding shellfish, peanuts, tree nuts in diet completely.   Medication allergy: no Hymenoptera allergy: no Urticaria: no Eczema:no History of recurrent infections suggestive of immunodeficency: no  Diagnostics: Spirometry:  Tracings reviewed. His effort: It was hard to get consistent efforts and there is a question as to whether this reflects a maximal maneuver. FVC: 3.32L FEV1: 2.33L, 68% predicted FEV1/FVC ratio: 70% Interpretation: No overt abnormalities noted given today's efforts with no improvement in FEV1 post bronchodilator treatment. Clinically feeling improved.   Please see scanned spirometry results for details.  Skin Testing: patient had to leave for work and postponed the skin testing.  Past Medical History: Patient Active Problem List   Diagnosis Date Noted   Anaphylactic shock due to adverse food reaction 09/27/2021   Chronic rhinitis 09/27/2021   Gastroesophageal reflux disease 09/27/2021   Colitis 04/04/2021   Decreased vision of right eye 09/07/2016   Allergic conjunctivitis of both eyes 09/07/2016   Hoarseness 04/24/2016   Rhinitis, allergic 04/24/2016   Not well controlled asthma without complication 01/17/2016   Past Medical History:  Diagnosis Date   Asthma    Past Surgical History: History reviewed. No pertinent surgical history. Medication List:  Current Outpatient Medications  Medication Sig Dispense Refill   albuterol (VENTOLIN HFA) 108 (90 Base) MCG/ACT inhaler Inhale 2 puffs into the lungs every 6 (six) hours as needed for wheezing or shortness of breath. Needs office visit 18 g 3   azelastine (ASTELIN) 0.1 % nasal spray Place 1-2 sprays into both nostrils 2 (two) times daily as needed (nasal drainage). Use in each nostril as directed 30 mL 5   budesonide-formoterol (SYMBICORT)  160-4.5 MCG/ACT inhaler Inhale 2 puffs into the lungs 2 (two) times daily. 1 each 6   cetirizine (ZYRTEC ALLERGY) 10 MG tablet Take 1 tablet (10 mg total) by mouth daily. 30 tablet 5   EPINEPHrine 0.3 mg/0.3 mL IJ SOAJ injection Inject 0.3 mg into the muscle as needed for anaphylaxis. 1 each 2   olopatadine (PATADAY) 0.1 % ophthalmic solution Place 1 drop into both eyes 2 (two) times  daily. 5 mL 4   Spacer/Aero-Holding Chambers (AEROCHAMBER PLUS) inhaler Use as instructed 1 each 2   famotidine (PEPCID) 20 MG tablet Take 1 tablet (20 mg total) by mouth 2 (two) times daily. Take 1 pill with breakfast and 1 pill with dinner for 1 week.  After this, you can take as needed up to twice daily with meals. (Patient not taking: Reported on 09/27/2021) 30 tablet 2   fluticasone (FLONASE) 50 MCG/ACT nasal spray Place 2 sprays into both nostrils 2 (two) times daily. Must have office visit for refills 16 g 1   omeprazole (PRILOSEC) 20 MG capsule Take 1 capsule (20 mg total) by mouth daily. (Patient not taking: Reported on 09/27/2021) 14 capsule 0   ondansetron (ZOFRAN ODT) 8 MG disintegrating tablet Take 1 tablet (8 mg total) by mouth every 8 (eight) hours as needed for nausea or vomiting. (Patient not taking: Reported on 09/27/2021) 20 tablet 0   No current facility-administered medications for this visit.   Allergies: Allergies  Allergen Reactions   Caffeine    Ibuprofen     Pruritis    Strawberry (Diagnostic) Itching   Social History: Social History   Socioeconomic History   Marital status: Married    Spouse name: Not on file   Number of children: Not on file   Years of education: Not on file   Highest education level: Not on file  Occupational History   Occupation: [employer]   Tobacco Use   Smoking status: Never   Smokeless tobacco: Never  Vaping Use   Vaping Use: Never used  Substance and Sexual Activity   Alcohol use: No    Alcohol/week: 0.0 standard drinks   Drug use: No    Sexual activity: Yes  Other Topics Concern   Not on file  Social History Narrative   Works at [employer]    From [place]    Social Determinants of Corporate investment banker Strain: Not on file  Food Insecurity: Not on file  Transportation Needs: Not on file  Physical Activity: Not on file  Stress: Not on file  Social Connections: Not on file   Lives in a house. Smoking: denies Occupation: works at [employer].   Environmental History: Water Damage/mildew in the house: no Carpet in the family room: yes Carpet in the bedroom: yes Heating: heat pump Cooling: heat pump Pet: no  Family History: Family History  Problem Relation Age of Onset   Healthy Father    Problem                               Relation Asthma                                   No Eczema                                No Food allergy                          No  Allergic rhino conjunctivitis     daughter   Review of Systems  Constitutional:  Negative for appetite change, chills, fever and unexpected weight change.  HENT:  Positive for congestion, rhinorrhea and sneezing.   Eyes:  Positive for itching.  Respiratory:  Positive for chest tightness, shortness of breath and wheezing. Negative for cough.   Cardiovascular:  Negative for chest pain.  Gastrointestinal:  Negative for abdominal pain.  Genitourinary:  Negative for difficulty urinating.  Skin:  Negative for rash.  Allergic/Immunologic: Positive for food allergies.  Neurological:  Negative for headaches.   Objective: BP 124/80   Pulse 83   Temp (!) 96.7 F (35.9 C)   Resp 18   Ht 5\' 10"  (1.778 m)   Wt 189 lb 12.8 oz (86.1 kg)   SpO2 99%   BMI 27.23 kg/m  Body mass index is 27.23 kg/m. Physical Exam Vitals and nursing note reviewed.  Constitutional:      Appearance: Normal appearance. He is well-developed.  HENT:     Head: Normocephalic and atraumatic.     Right Ear: Tympanic membrane and external ear normal.      Left Ear: Tympanic membrane and external ear normal.     Nose: Nose normal.     Mouth/Throat:     Mouth: Mucous membranes are moist.     Pharynx: Oropharynx is clear.  Eyes:     Conjunctiva/sclera: Conjunctivae normal.  Cardiovascular:     Rate and Rhythm: Normal rate and regular rhythm.     Heart sounds: Normal heart sounds. No murmur heard.   No friction rub. No gallop.  Pulmonary:     Effort: Pulmonary effort is normal.     Breath sounds: Normal breath sounds. No wheezing, rhonchi or rales.  Musculoskeletal:     Cervical back: Neck supple.  Skin:    General: Skin is warm.     Findings: No rash.  Neurological:     Mental Status: He is alert and oriented to person, place, and time.  Psychiatric:        Behavior: Behavior normal.  The plan was reviewed with the patient/family, and all questions/concerned were addressed.  It was my pleasure to see Dhruv today and participate in his care. Please feel free to contact me with any questions or concerns.  Sincerely,  , DO Allergy & Immunology  [facility] office: [phone] [facility] office: [phone]

## 2021-09-27 NOTE — Patient Instructions (Addendum)
Asthma:  Daily controller medication(s): START Symbicort 2 puffs twice a day with spacer and rinse mouth afterwards with spacer and rinse mouth afterwards. If you are getting nauseous then let me know.  Spacer prescription given and demonstrated proper use with inhaler. Patient understood technique and all questions/concerned were addressed.  May use albuterol rescue inhaler 2 puffs every 4 to 6 hours as needed for shortness of breath, chest tightness, coughing, and wheezing. May use albuterol rescue inhaler 2 puffs 5 to 15 minutes prior to strenuous physical activities. Monitor frequency of use.  Asthma control goals:  Full participation in all desired activities (may need albuterol before activity) Albuterol use two times or less a week on average (not counting use with activity) Cough interfering with sleep two times or less a month Oral steroids no more than once a year No hospitalizations   Food allergy: Start strict avoidance of shellfish, peanuts, tree nuts, strawberries, eggplant I have prescribed epinephrine injectable device and demonstrated proper use. For mild symptoms you can take over the counter antihistamines such as Benadryl and monitor symptoms closely. If symptoms worsen or if you have severe symptoms including breathing issues, throat closure, significant swelling, whole body hives, severe diarrhea and vomiting, lightheadedness then inject epinephrine and seek immediate medical care afterwards. Emergency action plan given.  Rhinitis: Use Flonase (fluticasone) nasal spray 1 spray per nostril twice a day as needed for nasal congestion.  Use azelastine nasal spray 1-2 sprays per nostril twice a day as needed for runny nose/drainage. Use over the counter antihistamines such as Zyrtec (cetirizine) daily as needed.  Return for skin testing. No zyrtec or azelastine nasal spray for 3 days before.  Follow up for skin testing - no allergy medications for 3 days before.   Recommend eye exam.

## 2021-09-27 NOTE — Assessment & Plan Note (Signed)
Shellfish, peanuts and tree nuts cause trouble breathing.  Eggplant and strawberries cause rash.  2021 blood work was positive to clam, shrimp, scallop.  Start strict avoidance of shellfish, peanuts, tree nuts, strawberries, eggplant  I have prescribed epinephrine injectable device and demonstrated proper use. For mild symptoms you can take over the counter antihistamines such as Benadryl and monitor symptoms closely. If symptoms worsen or if you have severe symptoms including breathing issues, throat closure, significant swelling, whole body hives, severe diarrhea and vomiting, lightheadedness then inject epinephrine and seek immediate medical care afterwards.  Emergency action plan given.  Plan on skin testing to seafood, peanuts, tree nuts and strawberries at next visit.

## 2021-09-27 NOTE — Assessment & Plan Note (Signed)
Diagnosed with asthma since age 45. Currently only using Symbicort as needed about 3-4 times per week with good benefit. It makes him nauseous though. He does have reflux.   Today's spirometry showed: No overt abnormalities noted given today's efforts with no improvement in FEV1 post bronchodilator treatment. Clinically feeling improved.  . Discussed the difference between rescue inhaler and maintenance inhaler - not clear if patient grasped the concept and will review again at next visit.  . Daily controller medication(s): START Symbicort 2 puffs twice a day with spacer and rinse mouth afterwards with spacer and rinse mouth afterwards. o If you are getting nauseous then let me know. Marland Kitchen Spacer prescription given and demonstrated proper use with inhaler. Patient understood technique and all questions/concerned were addressed.  . May use albuterol rescue inhaler 2 puffs every 4 to 6 hours as needed for shortness of breath, chest tightness, coughing, and wheezing. May use albuterol rescue inhaler 2 puffs 5 to 15 minutes prior to strenuous physical activities. Monitor frequency of use.  . Get spirometry at next visit.

## 2021-09-27 NOTE — Assessment & Plan Note (Signed)
Perennial rhinoconjunctivitis symptoms for 40+ years with worsening in the better changes.  Tried Zyrtec, Flonase with minimal benefit.  No prior allergy/ENT evaluation. 2021 mold panel bloodwork was positive to select mold and IgE 1248.   Unable to skin test today as patient had to leave for work and he works 1 hour away. . Use Flonase (fluticasone) nasal spray 1 spray per nostril twice a day as needed for nasal congestion.  . Use azelastine nasal spray 1-2 sprays per nostril twice a day as needed for runny nose/drainage. . Use over the counter antihistamines such as Zyrtec (cetirizine) daily as needed.  . Return for skin testing. o No zyrtec or azelastine nasal spray for 3 days before.

## 2021-09-27 NOTE — Assessment & Plan Note (Signed)
Patient states he is not taking any medications for this but on EMR he was supposed to be taking famotidine 20mg  BID and omeprazole 20mg  daily.   Continue with lifestyle and dietary modifications.  Follow up with PCP regarding the medications.

## 2021-10-05 ENCOUNTER — Ambulatory Visit (HOSPITAL_COMMUNITY)
Admission: EM | Admit: 2021-10-05 | Discharge: 2021-10-05 | Disposition: A | Discharge status: Home / Self Care | Payer: BC Managed Care – PPO | Attending: Medical Oncology | Admitting: Medical Oncology

## 2021-10-05 ENCOUNTER — Encounter (HOSPITAL_COMMUNITY): Payer: Self-pay | Admitting: Emergency Medicine

## 2021-10-05 ENCOUNTER — Telehealth: Payer: Self-pay

## 2021-10-05 DIAGNOSIS — M79605 Pain in left leg: Secondary | ICD-10-CM | POA: Diagnosis not present

## 2021-10-05 MED ORDER — ETODOLAC 400 MG PO TABS: 400.0000 mg | ORAL_TABLET | Freq: Two times a day (BID) | ORAL | 3 refills | Status: DC
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
## 2021-10-05 NOTE — Telephone Encounter (Signed)
Pt came into the office today to schedule a hospital f/u with Dr. Delford Field. Pt states he had his job fax over [employer]. Pt is requesting a follow up in regards to see if FMLA has been received
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
## 2021-10-07 NOTE — Telephone Encounter (Signed)
Carly i have never seen any paperwork yet on this patient
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
## 2021-10-10 NOTE — Telephone Encounter (Signed)
Called pt  and left vm to give [place] a call back.  Interpreter:Sasia number:[phone]

## 2021-10-11 ENCOUNTER — Other Ambulatory Visit: Payer: Self-pay

## 2021-10-11 ENCOUNTER — Encounter: Payer: Self-pay | Admitting: Critical Care Medicine

## 2021-10-11 ENCOUNTER — Ambulatory Visit: Payer: BC Managed Care – PPO | Attending: Critical Care Medicine | Admitting: Critical Care Medicine

## 2021-10-11 VITALS — BP 117/77 | HR 81 | Resp 16 | Wt 186.2 lb

## 2021-10-11 DIAGNOSIS — H1013 Acute atopic conjunctivitis, bilateral: Secondary | ICD-10-CM | POA: Diagnosis not present

## 2021-10-11 DIAGNOSIS — J4540 Moderate persistent asthma, uncomplicated: Secondary | ICD-10-CM

## 2021-10-11 DIAGNOSIS — S8992XD Unspecified injury of left lower leg, subsequent encounter: Secondary | ICD-10-CM | POA: Diagnosis not present

## 2021-10-11 DIAGNOSIS — S8992XA Unspecified injury of left lower leg, initial encounter: Secondary | ICD-10-CM | POA: Insufficient documentation

## 2021-10-11 DIAGNOSIS — J4541 Moderate persistent asthma with (acute) exacerbation: Secondary | ICD-10-CM | POA: Diagnosis not present

## 2021-10-11 DIAGNOSIS — Z1211 Encounter for screening for malignant neoplasm of colon: Secondary | ICD-10-CM

## 2021-10-11 MED ORDER — BUDESONIDE-FORMOTEROL FUMARATE 160-4.5 MCG/ACT IN AERO: 2.0000 | INHALATION_SPRAY | Freq: Two times a day (BID) | RESPIRATORY_TRACT | 6 refills | Status: DC

## 2021-10-11 MED ORDER — ACETAMINOPHEN-CODEINE #3 300-30 MG PO TABS: 1.0000 | ORAL_TABLET | Freq: Four times a day (QID) | ORAL | 0 refills | Status: AC | PRN

## 2021-10-11 NOTE — Assessment & Plan Note (Signed)
Moderate persistent asthma stable at this time continue Symbicort as prescribed
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
## 2021-10-11 NOTE — Assessment & Plan Note (Signed)
Allergic conjunctivitis resolved

## 2021-10-19 ENCOUNTER — Encounter: Payer: Self-pay | Admitting: Critical Care Medicine

## 2021-11-03 ENCOUNTER — Telehealth: Payer: Self-pay | Admitting: Critical Care Medicine
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
## 2021-11-04 NOTE — Telephone Encounter (Signed)
Called  Pt and left vm ( needed more clarification on what he needs), will call back later

## 2021-11-05 NOTE — Patient Instructions (Incomplete)
Asthma:  Daily controller medication(s): Continue Symbicort 2 puffs twice a day with spacer and rinse mouth afterwards with spacer and rinse mouth afterwards. May use albuterol rescue inhaler 2 puffs every 4 to 6 hours as needed for shortness of breath, chest tightness, coughing, and wheezing. May use albuterol rescue inhaler 2 puffs 5 to 15 minutes prior to strenuous physical activities. Monitor frequency of use.  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 In case of an allergic reaction, give Benadryl 4 teaspoonfuls every 4 hours, and if life-threatening symptoms occur, inject with EpiPen 0.3 mg.   Chronic rhinitis Use Flonase (fluticasone) nasal spray 1 spray per nostril twice a day as needed for nasal congestion.  Use azelastine nasal spray 1-2 sprays per nostril twice a day as needed for runny nose/drainage. Use over the counter antihistamines such as Zyrtec (cetirizine) daily as needed.  Return for skin testing. No zyrtec or azelastine nasal spray for 3 days before.  Please let us know if this treatment plan is not working well for you. Schedule a follow up appointment in months

## 2021-11-07 ENCOUNTER — Ambulatory Visit: Payer: BC Managed Care – PPO | Admitting: Family

## 2021-11-09 NOTE — Telephone Encounter (Signed)
Pt calling stating that he is needing to have a medical certification. He states that they are needing to have medical records in regards to his asthma and allergies. He states that [employer] has requested this from the office that they sent over multiple fax and have not received an answer. He states that this was on 11/1, 11/2, 11/9, 11/29 and have not received any responses from the office.    Callback# [phone] (PATIENT)   Fax# ([employer]): [phone]
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
## 2021-11-11 NOTE — Telephone Encounter (Signed)
Called pt and left vm for scheduling

## 2021-11-16 ENCOUNTER — Telehealth: Payer: Self-pay

## 2021-11-16 NOTE — Telephone Encounter (Signed)
Pt came by the office to drop off FMLA paperwork. Will place in provider box

## 2021-11-17 NOTE — Telephone Encounter (Signed)
Forms were faxed today. 
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
## 2021-11-22 NOTE — Telephone Encounter (Signed)
Called pt and left vm via Interpreter ID [phone]

## 2021-12-07 ENCOUNTER — Encounter (HOSPITAL_COMMUNITY): Payer: Self-pay

## 2021-12-07 ENCOUNTER — Ambulatory Visit (HOSPITAL_COMMUNITY)
Admission: EM | Admit: 2021-12-07 | Discharge: 2021-12-07 | Disposition: A | Discharge status: Home / Self Care | Payer: BC Managed Care – PPO | Attending: Physician Assistant | Admitting: Physician Assistant

## 2021-12-07 ENCOUNTER — Other Ambulatory Visit: Payer: Self-pay

## 2021-12-07 ENCOUNTER — Telehealth (HOSPITAL_COMMUNITY): Payer: Self-pay

## 2021-12-07 DIAGNOSIS — K2900 Acute gastritis without bleeding: Secondary | ICD-10-CM

## 2021-12-07 DIAGNOSIS — R1012 Left upper quadrant pain: Secondary | ICD-10-CM

## 2021-12-07 MED ORDER — PANTOPRAZOLE SODIUM 40 MG PO TBEC: 40.0000 mg | DELAYED_RELEASE_TABLET | Freq: Every day | ORAL | 0 refills | Status: DC

## 2021-12-07 MED ORDER — SUCRALFATE 1 G PO TABS: 1.0000 g | ORAL_TABLET | Freq: Three times a day (TID) | ORAL | 0 refills | Status: DC

## 2021-12-07 NOTE — ED Triage Notes (Signed)
Pt reports stomach pain x 2 days.

## 2021-12-07 NOTE — Discharge Instructions (Signed)
I believe that you have inflammation of your stomach.  Please start Protonix 30 minutes before a meal or 2 hours after.  Use Carafate before each meal and at night for 1 week.  Avoid spicy/thick/fatty foods.  Follow-up with GI specialist in a few weeks as scheduled.  If you have any worsening symptoms including severe abdominal pain, fever, nausea, vomiting, blood in your stool, blood in vomit you need to go to the emergency room as we discussed.

## 2021-12-07 NOTE — ED Provider Notes (Signed)
[place]    CSN: [id number] Arrival date & time: 12/07/21  1806      History   Chief Complaint Chief Complaint  Patient presents with   Abdominal Pain    HPI Ernest Griffin is a 46 y.o. male.   Patient presents today with a 2-day history of left-sided abdominal pain.  Denies any injury or change in diet prior to symptom onset.  Has a history of the same that has resolved with acid reducing medications but has not been taking these recently.  He is scheduled to see a gastroenterologist in a few weeks but has not yet seen a specialist.  He denies regular alcohol consumption or NSAID use.  He denies any recent travel, known sick contacts, suspicious food intake.  Pain is rated 8 on a 0-10 pain scale, localized to left upper quadrant without radiation, described as itching/gnawing, no aggravating relieving factors identified.  He denies any blood in his stool, melena, hematochezia, nausea, vomiting, fever, lightheadedness.  Denies previous abdominal surgeries.   Past Medical History:  Diagnosis Date   Asthma     Patient Active Problem List   Diagnosis Date Noted   Injury of left leg 10/11/2021   Chronic rhinitis 09/27/2021   Gastroesophageal reflux disease 09/27/2021   Rhinitis, allergic 04/24/2016   Asthma, moderate persistent 01/17/2016    History reviewed. No pertinent surgical history.     Home Medications    Prior to Admission medications   Medication Sig Start Date End Date Taking? Authorizing Provider  albuterol (VENTOLIN HFA) 108 (90 Base) MCG/ACT inhaler Inhale 2 puffs into the lungs every 6 (six) hours as needed for wheezing or shortness of breath. Needs office visit 04/04/21   Elsie Stain, MD  ASPIRIN LOW DOSE 81 MG EC tablet Take 81 mg by mouth 2 (two) times daily. 05/24/21   [provider]  azelastine (ASTELIN) 0.1 % nasal spray Place 1-2 sprays into both nostrils 2 (two) times daily as needed (nasal drainage). Use in each nostril as  directed 09/27/21   Garnet Sierras, DO  budesonide-formoterol [facility]) 160-4.5 MCG/ACT inhaler Inhale 2 puffs into the lungs 2 (two) times daily. 10/11/21   Elsie Stain, MD  cetirizine (ZYRTEC ALLERGY) 10 MG tablet Take 1 tablet (10 mg total) by mouth daily. 09/27/21   Garnet Sierras, DO  EPINEPHrine 0.3 mg/0.3 mL IJ SOAJ injection Inject 0.3 mg into the muscle as needed for anaphylaxis. 09/27/21   Garnet Sierras, DO  fluticasone (FLONASE) 50 MCG/ACT nasal spray Place 2 sprays into both nostrils 2 (two) times daily. Must have office visit for refills 04/04/21 05/04/21  Elsie Stain, MD  olopatadine (PATADAY) 0.1 % ophthalmic solution Place 1 drop into both eyes 2 (two) times daily. 04/04/21   Elsie Stain, MD  pantoprazole (PROTONIX) 40 MG tablet Take 1 tablet (40 mg total) by mouth daily. 12/07/21   Dezyrae Kensinger, Derry Skill, PA-C  Spacer/Aero-Holding Chambers (AEROCHAMBER PLUS) inhaler Use as instructed 09/27/21   Garnet Sierras, DO  sucralfate (CARAFATE) 1 g tablet Take 1 tablet (1 g total) by mouth 4 (four) times daily -  with meals and at bedtime. 12/07/21   Aleanna Menge, Derry Skill, PA-C  levocetirizine (XYZAL) 5 MG tablet Take 1 tablet (5 mg total) by mouth every evening. To decrease allergy symptoms 04/02/19 06/16/19  Antony Blackbird, MD    Family History Family History  Problem Relation Age of Onset   Healthy Father  Social History Social History   Tobacco Use   Smoking status: Never   Smokeless tobacco: Never  Vaping Use   Vaping Use: Never used  Substance Use Topics   Alcohol use: No    Alcohol/week: 0.0 standard drinks   Drug use: No     Allergies   Caffeine, Ibuprofen, and Strawberry (diagnostic)   Review of Systems Review of Systems  Constitutional:  Positive for activity change and appetite change. Negative for fatigue and fever.  Respiratory:  Negative for cough and shortness of breath.   Cardiovascular:  Negative for chest pain.  Gastrointestinal:  Positive for abdominal pain.  Negative for blood in stool, diarrhea, nausea and vomiting.  Neurological:  Negative for dizziness, light-headedness and headaches.    Physical Exam Triage Vital Signs ED Triage Vitals  Enc Vitals Group     BP 12/07/21 1946 113/82     Pulse Rate 12/07/21 1946 63     Resp 12/07/21 1946 19     Temp 12/07/21 1946 98.9 F (37.2 C)     Temp Source 12/07/21 1946 Oral     SpO2 --      Weight --      Height --      Head Circumference --      Peak Flow --      Pain Score 12/07/21 1945 4     Pain Loc --      Pain Edu? --      Excl. in [place]? --    No data found.  Updated Vital Signs BP 113/82 (BP Location: Right Arm)    Pulse 63    Temp 98.9 F (37.2 C) (Oral)    Resp 19   Visual Acuity Right Eye Distance:   Left Eye Distance:   Bilateral Distance:    Right Eye Near:   Left Eye Near:    Bilateral Near:     Physical Exam Vitals reviewed.  Constitutional:      General: He is awake.     Appearance: Normal appearance. He is well-developed. He is not ill-appearing.     Comments: Very pleasant male appears stated age in no acute distress sitting comfortably in exam room  HENT:     Head: Normocephalic and atraumatic.     Mouth/Throat:     Mouth: Mucous membranes are moist.     Pharynx: Uvula midline. No oropharyngeal exudate, posterior oropharyngeal erythema or uvula swelling.  Cardiovascular:     Rate and Rhythm: Normal rate and regular rhythm.     Heart sounds: Normal heart sounds, S1 normal and S2 normal. No murmur heard. Pulmonary:     Effort: Pulmonary effort is normal.     Breath sounds: Normal breath sounds. No stridor. No wheezing, rhonchi or rales.     Comments: Clear auscultation bilaterally Abdominal:     General: Bowel sounds are normal.     Palpations: Abdomen is soft.     Tenderness: There is abdominal tenderness in the left upper quadrant and left lower quadrant. There is no right CVA tenderness, left CVA tenderness, guarding or rebound.     Comments: Left  upper quadrant and left upper quadrant and left lower quadrant.  No evidence of acute abdomen on physical exam.  Musculoskeletal:     Cervical back: Normal range of motion and neck supple.  Neurological:     Mental Status: He is alert.  Psychiatric:        Behavior: Behavior is cooperative.     UC  Treatments / Results  Labs (all labs ordered are listed, but only abnormal results are displayed) Labs Reviewed - No data to display  EKG   Radiology No results found.  Procedures Procedures (including critical care time)  Medications Ordered in UC Medications - No data to display  Initial Impression / Assessment and Plan / UC Course  I have reviewed the triage vital signs and the nursing notes.  Pertinent labs & imaging results that were available during my care of the patient were reviewed by me and considered in my medical decision making (see chart for details).     Vital signs and physical exam reassuring today; no indication for emergent evaluation or imaging.  Concern for gastritis given clinical presentation.  Recommended he avoid NSAIDs.  Patient was started on Carafate as well as Protonix.  Recommended avoiding spicy/acidic/fatty foods and eating small frequent meals.  Given recurrent episodes of symptoms recommended that he follow-up with GI specialist; reports he has an appointment scheduled in several weeks and was drawn encouraged to keep this appointment.  Discussed that if he has any worsening symptoms including severe abdominal pain, nausea/vomiting or pain with oral intake, blood in stool, he needs to go to the emergency room.  Strict return precautions given to which he expressed understanding.  Final Clinical Impressions(s) / UC Diagnoses   Final diagnoses:  LUQ abdominal pain  Acute gastritis, presence of bleeding unspecified, unspecified gastritis type     Discharge Instructions      I believe that you have inflammation of your stomach.  Please start  Protonix 30 minutes before a meal or 2 hours after.  Use Carafate before each meal and at night for 1 week.  Avoid spicy/thick/fatty foods.  Follow-up with GI specialist in a few weeks as scheduled.  If you have any worsening symptoms including severe abdominal pain, fever, nausea, vomiting, blood in your stool, blood in vomit you need to go to the emergency room as we discussed.     ED Prescriptions     Medication Sig Dispense Auth. Provider   sucralfate (CARAFATE) 1 g tablet Take 1 tablet (1 g total) by mouth 4 (four) times daily -  with meals and at bedtime. 28 tablet Decorey Wahlert K, PA-C   pantoprazole (PROTONIX) 40 MG tablet Take 1 tablet (40 mg total) by mouth daily. 14 tablet Charity Tessier, Derry Skill, PA-C      PDMP not reviewed this encounter.   Terrilee Croak, PA-C 12/07/21 2043

## 2021-12-08 ENCOUNTER — Telehealth: Payer: Self-pay | Admitting: Critical Care Medicine

## 2021-12-08 NOTE — Telephone Encounter (Signed)
Copied from CRM [id number]. Topic: General - Other >> Dec 07, 2021  4:12 PM Gaetana Michaelis A wrote: Reason for CRM: The patient has been in contact with [employer] and told that medical certification of their asthma and allergies are needed   The certification can be sent to [phone] [employer]   Please contact the patient further if needed

## 2021-12-19 NOTE — Telephone Encounter (Signed)
Called pt to get further information. Pt does have an appointment for allergy testing might this be something for them ? Interpreter ID # [phone]

## 2021-12-19 NOTE — Telephone Encounter (Signed)
Ernest Griffin  I sent a referral 04/2021 to allergy and asthma  I do not see the pt ever made the appt  pls send another referral

## 2021-12-21 ENCOUNTER — Encounter (HOSPITAL_COMMUNITY): Payer: Self-pay | Admitting: Emergency Medicine

## 2021-12-21 ENCOUNTER — Other Ambulatory Visit: Payer: Self-pay

## 2021-12-21 ENCOUNTER — Ambulatory Visit (HOSPITAL_COMMUNITY)
Admission: EM | Admit: 2021-12-21 | Discharge: 2021-12-21 | Disposition: A | Discharge status: Home / Self Care | Payer: BC Managed Care – PPO | Attending: Family Medicine | Admitting: Family Medicine

## 2021-12-21 DIAGNOSIS — R1012 Left upper quadrant pain: Secondary | ICD-10-CM | POA: Diagnosis present

## 2021-12-21 LAB — COMPREHENSIVE METABOLIC PANEL
ALT: 23 U/L (ref 0–44)
AST: 25 U/L (ref 15–41)
Albumin: 4.1 g/dL (ref 3.5–5.0)
Alkaline Phosphatase: 79 U/L (ref 38–126)
Anion gap: 4 — ABNORMAL LOW (ref 5–15)
BUN: 10 mg/dL (ref 6–20)
CO2: 28 mmol/L (ref 22–32)
Calcium: 9.2 mg/dL (ref 8.9–10.3)
Chloride: 104 mmol/L (ref 98–111)
Creatinine, Ser: 1.08 mg/dL (ref 0.61–1.24)
GFR, Estimated: >60 mL/min (ref >60)
Glucose, Bld: 88 mg/dL (ref 70–99)
Potassium: 4.1 mmol/L (ref 3.5–5.1)
Sodium: 136 mmol/L (ref 135–145)
Total Bilirubin: 0.5 mg/dL (ref 0.3–1.2)
Total Protein: 7.3 g/dL (ref 6.5–8.1)

## 2021-12-21 LAB — CBC WITH DIFFERENTIAL/PLATELET
Abs Immature Granulocytes: 0.04 10*3/uL (ref 0.00–0.07)
Basophils Absolute: 0.1 10*3/uL (ref 0.0–0.1)
Basophils Relative: 1 %
Eosinophils Absolute: 0.4 10*3/uL (ref 0.0–0.5)
Eosinophils Relative: 7 %
HCT: 38.8 % — ABNORMAL LOW (ref 39.0–52.0)
Hemoglobin: 13.4 g/dL (ref 13.0–17.0)
Immature Granulocytes: 1 %
Lymphocytes Relative: 35 %
Lymphs Abs: 1.8 10*3/uL (ref 0.7–4.0)
MCH: 29.9 pg (ref 26.0–34.0)
MCHC: 34.5 g/dL (ref 30.0–36.0)
MCV: 86.6 fL (ref 80.0–100.0)
Monocytes Absolute: 0.5 10*3/uL (ref 0.1–1.0)
Monocytes Relative: 10 %
Neutro Abs: 2.4 10*3/uL (ref 1.7–7.7)
Neutrophils Relative %: 46 %
Platelets: 239 10*3/uL (ref 150–400)
RBC: 4.48 MIL/uL (ref 4.22–5.81)
RDW: 12.4 % (ref 11.5–15.5)
WBC: 5.2 10*3/uL (ref 4.0–10.5)
nRBC: 0 % (ref 0.0–0.2)

## 2021-12-21 LAB — LIPASE, BLOOD: Lipase: 43 U/L (ref 11–51)

## 2021-12-21 NOTE — Discharge Instructions (Signed)
You have been seen today for abdominal pain. Your evaluation was not suggestive of any emergent condition requiring medical intervention at this time. However, some abdominal problems make take more time to appear. Therefore, it is very important for you to pay attention to any new symptoms or worsening of your current condition.  Please return here or to the Emergency Department immediately should you begin to feel worse in any way or have any of the following symptoms: increasing or different abdominal pain, vomiting, inability to drink fluids, fevers, and/or shaking chills.   You have had labs (blood work) drawn today. We will call you with any significant abnormalities or if there is need to begin or change treatment or pursue further follow up.  You may also review your test results online through MyChart. If you do not have a MyChart account, instructions to sign up should be on your discharge paperwork.

## 2021-12-21 NOTE — ED Triage Notes (Signed)
Pt reports intermittent LUQ pains that will sometimes radiate towards left back that has been ongoing for over couple weeks.  Denies n/v, urinary or bowel problems.

## 2021-12-22 NOTE — ED Provider Notes (Signed)
[place]   [id number] 12/21/21 Arrival Time: [id number]  ASSESSMENT & PLAN:  1. Left upper quadrant abdominal pain    Unclear etiology of patient's reported abdominal pain. Unclear prognosis. CBC, CMP, lipase unremarkable. If this continues to worsen, he may need imaging or endoscopy. Discussed.  Labs Reviewed  CBC WITH DIFFERENTIAL/PLATELET - Abnormal; Notable for the following components:      Result Value   HCT 38.8 (*)    All other components within normal limits  COMPREHENSIVE METABOLIC PANEL - Abnormal; Notable for the following components:   Anion gap 4 (*)    All other components within normal limits  LIPASE, BLOOD   Benign abdominal exam. No indications for urgent abdominal/pelvic imaging at this time. Discussed.   Discharge Instructions      You have been seen today for abdominal pain. Your evaluation was not suggestive of any emergent condition requiring medical intervention at this time. However, some abdominal problems make take more time to appear. Therefore, it is very important for you to pay attention to any new symptoms or worsening of your current condition.  Please return here or to the Emergency Department immediately should you begin to feel worse in any way or have any of the following symptoms: increasing or different abdominal pain, vomiting, inability to drink fluids, fevers, and/or shaking chills.   You have had labs (blood work) drawn today. We will call you with any significant abnormalities or if there is need to begin or change treatment or pursue further follow up.  You may also review your test results online through [facility]. If you do not have a MyChart account, instructions to sign up should be on your discharge paperwork.      Follow-up Information     Elsie Stain, MD.   Specialty: Pulmonary Disease Why: As needed. Contact information: [address] [address] [phone]         [place].   Specialty: Emergency Medicine Why: If symptoms worsen in any way. Contact information: [address] [id number] [place] [phone]               Reviewed expectations re: course of current medical issues. Questions answered. Outlined signs and symptoms indicating need for more acute intervention. Patient verbalized understanding. After Visit Summary given.   SUBJECTIVE: History from: patient. Arabic interpreter used. Manvir A Villaneda is a 46 y.o. male who presents with complaint of intermittent LUQ abdominal discomfort. Onset gradual,  approx 2 weeks ago . Discomfort described as aching and cramping that occasionally goes to back; very sporadic; does not wake him at night. Reports normal flatus. Symptoms are unchanged since beginning. Fever: absent. Aggravating factors: have not been identified. Alleviating factors: have not been identified. Associated symptoms: none reported. He denies anorexia, arthralgias, belching, chills, constipation, diarrhea, dysuria, fever, headache, melena, myalgias, nausea, sweats, and vomiting. Appetite: normal. PO intake: normal. Ambulatory without assistance. Urinary symptoms: none. Bowel movements: have not significantly changed; last bowel movement yest and without blood. History of similar:  denied . OTC treatment: none.  History reviewed. No pertinent surgical history.   OBJECTIVE:  Vitals:   12/21/21 1940  BP: 119/76  Pulse: 61  Resp: 17  Temp: 98.1 F (36.7 C)  TempSrc: Oral  SpO2: 99%    General appearance: alert, oriented, no acute distress HEENT: [place]; AT; oropharynx moist Lungs: unlabored respirations Abdomen: soft; without distention; no specific tenderness to palpation; normal bowel  sounds; without masses or organomegaly; without guarding or rebound tenderness Back: without reported CVA tenderness; FROM at waist Extremities: without LE edema;  symmetrical; without gross deformities Skin: warm and dry Neurologic: normal gait Psychological: alert and cooperative; normal mood and affect  Labs: Results for orders placed or performed during the hospital encounter of 12/21/21  CBC with Differential/Platelet  Result Value Ref Range   WBC 5.2 4.0 - 10.5 K/uL   RBC 4.48 4.22 - 5.81 MIL/uL   Hemoglobin 13.4 13.0 - 17.0 g/dL   HCT 32.9 (L) 51.8 - 84.1 %   MCV 86.6 80.0 - 100.0 fL   MCH 29.9 26.0 - 34.0 pg   MCHC 34.5 30.0 - 36.0 g/dL   RDW 66.0 63.0 - 16.0 %   Platelets 239 150 - 400 K/uL   nRBC 0.0 0.0 - 0.2 %   Neutrophils Relative % 46 %   Neutro Abs 2.4 1.7 - 7.7 K/uL   Lymphocytes Relative 35 %   Lymphs Abs 1.8 0.7 - 4.0 K/uL   Monocytes Relative 10 %   Monocytes Absolute 0.5 0.1 - 1.0 K/uL   Eosinophils Relative 7 %   Eosinophils Absolute 0.4 0.0 - 0.5 K/uL   Basophils Relative 1 %   Basophils Absolute 0.1 0.0 - 0.1 K/uL   Immature Granulocytes 1 %   Abs Immature Granulocytes 0.04 0.00 - 0.07 K/uL  Comprehensive metabolic panel  Result Value Ref Range   Sodium 136 135 - 145 mmol/L   Potassium 4.1 3.5 - 5.1 mmol/L   Chloride 104 98 - 111 mmol/L   CO2 28 22 - 32 mmol/L   Glucose, Bld 88 70 - 99 mg/dL   BUN 10 6 - 20 mg/dL   Creatinine, Ser 1.09 0.61 - 1.24 mg/dL   Calcium 9.2 8.9 - 32.3 mg/dL   Total Protein 7.3 6.5 - 8.1 g/dL   Albumin 4.1 3.5 - 5.0 g/dL   AST 25 15 - 41 U/L   ALT 23 0 - 44 U/L   Alkaline Phosphatase 79 38 - 126 U/L   Total Bilirubin 0.5 0.3 - 1.2 mg/dL   GFR, Estimated >55 >73 mL/min   Anion gap 4 (L) 5 - 15  Lipase, blood  Result Value Ref Range   Lipase 43 11 - 51 U/L   Labs Reviewed  CBC WITH DIFFERENTIAL/PLATELET - Abnormal; Notable for the following components:      Result Value   HCT 38.8 (*)    All other components within normal limits  COMPREHENSIVE METABOLIC PANEL - Abnormal; Notable for the following components:   Anion gap 4 (*)    All other components within normal limits   LIPASE, BLOOD    Imaging: No results found.   Allergies  Allergen Reactions   Caffeine    Ibuprofen     Pruritis    Strawberry (Diagnostic) Itching                                               Past Medical History:  Diagnosis Date   Asthma     Social History   Socioeconomic History   Marital status: Married    Spouse name: Not on file   Number of children: Not on file   Years of education: Not on file   Highest education level: Not on file  Occupational History  Occupation: [employer]   Tobacco Use   Smoking status: Never   Smokeless tobacco: Never  Vaping Use   Vaping Use: Never used  Substance and Sexual Activity   Alcohol use: No    Alcohol/week: 0.0 standard drinks   Drug use: No   Sexual activity: Yes  Other Topics Concern   Not on file  Social History Narrative   Works at [employer]    From [place]    Social Determinants of Health   Financial Resource Strain: Not on file  Food Insecurity: Not on file  Transportation Needs: Not on file  Physical Activity: Not on file  Stress: Not on file  Social Connections: Not on file  Intimate Partner Violence: Not on file    Family History  Problem Relation Age of Onset   Healthy Father      Vanessa Kick, MD 12/22/21 1420

## 2021-12-24 NOTE — Patient Instructions (Addendum)
Asthma:  Daily controller medication(s): Start Symbicort 2 puffs twice a day with spacer and rinse mouth afterwards with spacer and rinse mouth afterwards.  Make sure and use this every day May use albuterol rescue inhaler 2 puffs every 4 to 6 hours as needed for shortness of breath, chest tightness, coughing, and wheezing. May use albuterol rescue inhaler 2 puffs 5 to 15 minutes prior to strenuous physical activities. Monitor frequency of use.  Asthma control goals:  Full participation in all desired activities (may need albuterol before activity) Albuterol use two times or less a week on average (not counting use with activity) Cough interfering with sleep two times or less a month Oral steroids no more than once a year No hospitalizations   Food allergy Continue strict avoidance of fish, shellfish, peanuts, tree nuts, strawberries, eggplant, and legumes.  He is able to eat tuna without any problems in case of an allergic reaction, give Benadryl 4 teaspoonfuls every 4 hours, and if life-threatening symptoms occur, inject with EpiPen 0.3 mg. Follow emergency action plan given at last office visit. We will get lab work to follow up on your food allergies. We will call you with results once they are all back  Seasonal and perennial allergic rhinitis Skin testing is positive today to dust mite, grass pollen, tree mix, cat, and [place] cockroach Use Flonase (fluticasone) nasal spray 1 spray per nostril twice a day as needed for nasal congestion.  Use azelastine nasal spray 1-2 sprays per nostril twice a day as needed for runny nose/drainage. Use over the counter antihistamines such as Zyrtec (cetirizine) daily as needed.  Start avoidance measures as below.  Gastroesophageal reflux disease Continue lifestyle and dietary modifications.  Follow-up with your primary care physician regarding the medications you are supposed to be taking.  Recommend getting an eye exam as recommended at your  last office visit.  Please let us know if this treatment plan is not working well for you. Schedule a follow up appointment in 2 months or sooner if needed  Control of Dust Mite Allergen Dust mites play a major role in allergic asthma and rhinitis. They occur in environments with high humidity wherever human skin is found. Dust mites absorb humidity from the atmosphere (ie, they do not drink) and feed on organic matter (including shed human and animal skin). Dust mites are a microscopic type of insect that you cannot see with the naked eye. High levels of dust mites have been detected from mattresses, pillows, carpets, upholstered furniture, bed covers, clothes, soft toys and any woven material. The principal allergen of the dust mite is found in its feces. A gram of dust may contain 1,000 mites and 250,000 fecal particles. Mite antigen is easily measured in the air during house cleaning activities. Dust mites do not bite and do not cause harm to humans, other than by triggering allergies/asthma.  Ways to decrease your exposure to dust mites in your home:  1. Encase mattresses, box springs and pillows with a mite-impermeable barrier or cover  2. Wash sheets, blankets and drapes weekly in hot water (130 F) with detergent and dry them in a dryer on the hot setting.  3. Have the room cleaned frequently with a vacuum cleaner and a damp dust-mop. For carpeting or rugs, vacuuming with a vacuum cleaner equipped with a high-efficiency particulate air (HEPA) filter. The dust mite allergic individual should not be in a room which is being cleaned and should wait 1 hour after cleaning before going  into the room.  4. Do not sleep on upholstered furniture (eg, couches).  5. If possible removing carpeting, upholstered furniture and drapery from the home is ideal. Horizontal blinds should be eliminated in the rooms where the person spends the most time (bedroom, study, television room). Washable vinyl,  roller-type shades are optimal.  6. Remove all non-washable stuffed toys from the bedroom. Wash stuffed toys weekly like sheets and blankets above.  7. Reduce indoor humidity to less than 50%. Inexpensive humidity monitors can be purchased at most hardware stores. Do not use a humidifier as can make the problem worse and are not recommended.  Reducing Pollen Exposure The American Academy of Allergy, Asthma and Immunology suggests the following steps to reduce your exposure to pollen during allergy seasons. Do not hang sheets or clothing out to dry; pollen may collect on these items. Do not mow lawns or spend time around freshly cut grass; mowing stirs up pollen. Keep windows closed at night.  Keep car windows closed while driving. Minimize morning activities outdoors, a time when pollen counts are usually at their highest. Stay indoors as much as possible when pollen counts or humidity is high and on windy days when pollen tends to remain in the air longer. Use air conditioning when possible.  Many air conditioners have filters that trap the pollen spores. Use a HEPA room air filter to remove pollen form the indoor air you breathe.  Control of Dog or Cat Allergen Avoidance is the best way to manage a dog or cat allergy. If you have a dog or cat and are allergic to dog or cats, consider removing the dog or cat from the home. If you have a dog or cat but dont want to find it a new home, or if your family wants a pet even though someone in the household is allergic, here are some strategies that may help keep symptoms at bay:  Keep the pet out of your bedroom and restrict it to only a few rooms. Be advised that keeping the dog or cat in only one room will not limit the allergens to that room. Dont pet, hug or kiss the dog or cat; if you do, wash your hands with soap and water. High-efficiency particulate air (HEPA) cleaners run continuously in a bedroom or living room can reduce allergen levels  over time. Regular use of a high-efficiency vacuum cleaner or a central vacuum can reduce allergen levels. Giving your dog or cat a bath at least once a week can reduce airborne allergen.  Control of Cockroach Allergen  Cockroach allergen has been identified as an important cause of acute attacks of asthma, especially in urban settings.  There are fifty-five species of cockroach that exist in the [place], however only three, the [place], [place] species produce allergen that can affect patients with Asthma.  Allergens can be obtained from fecal particles, egg casings and secretions from cockroaches.    Remove food sources. Reduce access to water. Seal access and entry points. Spray runways with 0.5-1% Diazinon or Chlorpyrifos Blow boric acid power under stoves and refrigerator. Place bait stations (hydramethylnon) at feeding sites.

## 2021-12-26 ENCOUNTER — Other Ambulatory Visit: Payer: Self-pay

## 2021-12-26 ENCOUNTER — Encounter: Payer: Self-pay | Admitting: Family

## 2021-12-26 ENCOUNTER — Ambulatory Visit (INDEPENDENT_AMBULATORY_CARE_PROVIDER_SITE_OTHER): Payer: BC Managed Care – PPO | Admitting: Family

## 2021-12-26 VITALS — BP 114/78 | HR 88 | Temp 98.1°F | Resp 24 | Ht 69.0 in | Wt 183.4 lb

## 2021-12-26 DIAGNOSIS — K219 Gastro-esophageal reflux disease without esophagitis: Secondary | ICD-10-CM | POA: Diagnosis not present

## 2021-12-26 DIAGNOSIS — J302 Other seasonal allergic rhinitis: Secondary | ICD-10-CM

## 2021-12-26 DIAGNOSIS — J3089 Other allergic rhinitis: Secondary | ICD-10-CM | POA: Diagnosis not present

## 2021-12-26 DIAGNOSIS — T7800XD Anaphylactic reaction due to unspecified food, subsequent encounter: Secondary | ICD-10-CM

## 2021-12-26 DIAGNOSIS — J45909 Unspecified asthma, uncomplicated: Secondary | ICD-10-CM

## 2021-12-26 NOTE — Progress Notes (Signed)
[place] [address] Dept: [phone]  FOLLOW UP NOTE  Patient ID: Ernest Griffin, male    DOB: 1976/10/15  Age: 46 y.o. MRN: [id number] Date of Office Visit: 12/26/2021  Assessment  Chief Complaint: Allergy Testing (Seafood, strawberry, eggplant and environmental as well), Other (Need forms of documentation about his asthma and allergies to present to [facility]. ), and Asthma  HPI Ernest Griffin is a 46 year old male who presents today for follow-up of not well controlled asthma without complication, chronic rhinitis, anaphylactic shock due to adverse food reaction, and gastroesophageal reflux disease.  He was last seen on September 27, 2021 by Dr. Maudie Mercury.  An interpreter is here with him today and helps provide history.  He denies any new diagnosis or surgeries since his last appointment.    Asthma is reported as moderately controlled with Symbicort 160/4.5 mcg as needed, and albuterol as needed.  He reports a dry cough sometimes, wheezing sometimes, tightness in his chest sometimes, shortness of breath sometimes, and nocturnal awakenings due to cough sometimes.  He denies any systemic steroids or trips to the emergency room or urgent care due to breathing problems since we last saw him.  He mentions that he uses his albuterol as needed and sometimes goes a week without using.  Chronic rhinitis is reported as controlled right now during the winter.  During the spring/summer months he reports that his symptoms are worse.  He reports dryness in his throat right now and denies rhinorrhea, nasal congestion, and postnasal drip.  He has not had any sinus infections since we last saw him.  He sometimes will take Zyrtec as needed and will use Flonase nasal spray as needed.  He does not use azelastine nasal spray.  He continues to avoid fish, shellfish, peanuts, tree nuts, strawberries, eggplant, and legumes without any accidental ingestion or use of his EpiPen.  He reports that he is able to  eat tuna without any problems.  He reports that all of these foods cause itching.  After reviewing Dr. Julianne Rice note she reports that shellfish, peanuts and tree nuts cause trouble breathing and eggplant and strawberries caused a rash.   Drug Allergies:  Allergies  Allergen Reactions   Caffeine    Ibuprofen     Pruritis    Strawberry (Diagnostic) Itching    Review of Systems: Review of Systems  Constitutional:  Negative for chills and fever.  HENT:         Denies rhinorrhea, nasal congestion, and postnasal drip right now.  Reports a dryness in his throat.  Eyes:        Denies itchy watery eyes right now, but reports in spring/summer he will have symptoms  Respiratory:  Positive for cough, shortness of breath and wheezing.        Reports some dry cough at times, wheezing at times, tightness in his chest at times, shortness of breath at times, and nocturnal awakenings due to cough.  Cardiovascular:  Negative for chest pain.  Gastrointestinal:        Denies heartburn or reflux symptoms  Genitourinary:  Negative for frequency.  Skin:  Positive for itching. Negative for rash.       Reports itching if he eats legumes, fish, shellfish, peanuts, tree nuts, strawberry, and eggplant  Neurological:  Positive for headaches.       Reports that he gets a headache if he gets too hot    Physical Exam: BP 114/78    Pulse 88  Temp 98.1 F (36.7 C) (Temporal)    Resp (!) 24    Ht 5\' 9"  (1.753 m)    Wt 183 lb 6.4 oz (83.2 kg)    SpO2 100%    BMI 27.08 kg/m    Physical Exam Constitutional:      Appearance: Normal appearance.  HENT:     Head: Normocephalic and atraumatic.     Comments: Pharynx normal, eyes normal, ears normal, nose normal    Right Ear: Tympanic membrane, ear canal and external ear normal.     Left Ear: Tympanic membrane, ear canal and external ear normal.     Nose: Nose normal.     Mouth/Throat:     Mouth: Mucous membranes are moist.     Pharynx: Oropharynx is clear.  Eyes:      Conjunctiva/sclera: Conjunctivae normal.  Cardiovascular:     Rate and Rhythm: Normal rate and regular rhythm.     Heart sounds: Normal heart sounds.  Pulmonary:     Effort: Pulmonary effort is normal.     Breath sounds: Normal breath sounds.     Comments: Lungs clear to auscultation Musculoskeletal:     Cervical back: Neck supple.  Skin:    General: Skin is warm.  Neurological:     Mental Status: He is alert and oriented to person, place, and time.  Psychiatric:        Mood and Affect: Mood normal.        Behavior: Behavior normal.        Thought Content: Thought content normal.        Judgment: Judgment normal.    Diagnostics: FVC 3.17 L, FEV1 2.58 L.  Predicted FVC 4.12 L, predicted FEV1 3.30 L.  Spirometry indicates normal respiratory function.  Percutaneous skin testing was positive to dust mite with a good histamine response.  He was also positive to shellfish mix.(3x4)  Intradermal's were slightly positive to [place] grass and grass mix.  Positive to tree mix, cat, and [place] cockroach  Assessment and Plan: 1. Not well controlled asthma without complication   2. Seasonal and perennial allergic rhinitis   3. Anaphylactic shock due to food, subsequent encounter   4. Gastroesophageal reflux disease, unspecified whether esophagitis present     No orders of the defined types were placed in this encounter.   Patient Instructions  Asthma:  Daily controller medication(s): Start Symbicort 153mcg 2 puffs twice a day with spacer and rinse mouth afterwards with spacer and rinse mouth afterwards.  Make sure and use this every day May use albuterol rescue inhaler 2 puffs every 4 to 6 hours as needed for shortness of breath, chest tightness, coughing, and wheezing. May use albuterol rescue inhaler 2 puffs 5 to 15 minutes prior to strenuous physical activities. Monitor frequency of use.  Asthma control goals:  Full participation in all desired activities (may need albuterol  before activity) Albuterol use two times or less a week on average (not counting use with activity) Cough interfering with sleep two times or less a month Oral steroids no more than once a year No hospitalizations   Food allergy Continue strict avoidance of fish, shellfish, peanuts, tree nuts, strawberries, eggplant, and legumes.  He is able to eat tuna without any problems in case of an allergic reaction, give Benadryl 4 teaspoonfuls every 4 hours, and if life-threatening symptoms occur, inject with EpiPen 0.3 mg. Follow emergency action plan given at last office visit. We will get lab work to follow up on  your food allergies. We will call you with results once they are all back  Seasonal and perennial allergic rhinitis Skin testing is positive today to dust mite, grass pollen, tree mix, cat, and [place] cockroach Use Flonase (fluticasone) nasal spray 1 spray per nostril twice a day as needed for nasal congestion.  Use azelastine nasal spray 1-2 sprays per nostril twice a day as needed for runny nose/drainage. Use over the counter antihistamines such as Zyrtec (cetirizine) daily as needed.  Start avoidance measures as below.  Gastroesophageal reflux disease Continue lifestyle and dietary modifications.  Follow-up with your primary care physician regarding the medications you are supposed to be taking.  Recommend getting an eye exam as recommended at your last office visit.  Please let us know if this treatment plan is not working well for you. Schedule a follow up appointment in 2 months or sooner if needed  Control of Dust Mite Allergen Dust mites play a major role in allergic asthma and rhinitis. They occur in environments with high humidity wherever human skin is found. Dust mites absorb humidity from the atmosphere (ie, they do not drink) and feed on organic matter (including shed human and animal skin). Dust mites are a microscopic type of insect that you cannot see with the naked  eye. High levels of dust mites have been detected from mattresses, pillows, carpets, upholstered furniture, bed covers, clothes, soft toys and any woven material. The principal allergen of the dust mite is found in its feces. A gram of dust may contain 1,000 mites and 250,000 fecal particles. Mite antigen is easily measured in the air during house cleaning activities. Dust mites do not bite and do not cause harm to humans, other than by triggering allergies/asthma.  Ways to decrease your exposure to dust mites in your home:  1. Encase mattresses, box springs and pillows with a mite-impermeable barrier or cover  2. Wash sheets, blankets and drapes weekly in hot water (130 F) with detergent and dry them in a dryer on the hot setting.  3. Have the room cleaned frequently with a vacuum cleaner and a damp dust-mop. For carpeting or rugs, vacuuming with a vacuum cleaner equipped with a high-efficiency particulate air (HEPA) filter. The dust mite allergic individual should not be in a room which is being cleaned and should wait 1 hour after cleaning before going into the room.  4. Do not sleep on upholstered furniture (eg, couches).  5. If possible removing carpeting, upholstered furniture and drapery from the home is ideal. Horizontal blinds should be eliminated in the rooms where the person spends the most time (bedroom, study, television room). Washable vinyl, roller-type shades are optimal.  6. Remove all non-washable stuffed toys from the bedroom. Wash stuffed toys weekly like sheets and blankets above.  7. Reduce indoor humidity to less than 50%. Inexpensive humidity monitors can be purchased at most hardware stores. Do not use a humidifier as can make the problem worse and are not recommended.  Reducing Pollen Exposure The American Academy of Allergy, Asthma and Immunology suggests the following steps to reduce your exposure to pollen during allergy seasons. Do not hang sheets or clothing out to  dry; pollen may collect on these items. Do not mow lawns or spend time around freshly cut grass; mowing stirs up pollen. Keep windows closed at night.  Keep car windows closed while driving. Minimize morning activities outdoors, a time when pollen counts are usually at their highest. Stay indoors as much as possible when  pollen counts or humidity is high and on windy days when pollen tends to remain in the air longer. Use air conditioning when possible.  Many air conditioners have filters that trap the pollen spores. Use a HEPA room air filter to remove pollen form the indoor air you breathe.  Control of Dog or Cat Allergen Avoidance is the best way to manage a dog or cat allergy. If you have a dog or cat and are allergic to dog or cats, consider removing the dog or cat from the home. If you have a dog or cat but dont want to find it a new home, or if your family wants a pet even though someone in the household is allergic, here are some strategies that may help keep symptoms at bay:  Keep the pet out of your bedroom and restrict it to only a few rooms. Be advised that keeping the dog or cat in only one room will not limit the allergens to that room. Dont pet, hug or kiss the dog or cat; if you do, wash your hands with soap and water. High-efficiency particulate air (HEPA) cleaners run continuously in a bedroom or living room can reduce allergen levels over time. Regular use of a high-efficiency vacuum cleaner or a central vacuum can reduce allergen levels. Giving your dog or cat a bath at least once a week can reduce airborne allergen.  Control of Cockroach Allergen  Cockroach allergen has been identified as an important cause of acute attacks of asthma, especially in urban settings.  There are fifty-five species of cockroach that exist in the [place], however only three, the [place], [place] species produce allergen that can affect patients with Asthma.  Allergens can be  obtained from fecal particles, egg casings and secretions from cockroaches.    Remove food sources. Reduce access to water. Seal access and entry points. Spray runways with 0.5-1% Diazinon or Chlorpyrifos Blow boric acid power under stoves and refrigerator. Place bait stations (hydramethylnon) at feeding sites.     Return in about 2 months (around 02/23/2022), or if symptoms worsen or fail to improve.    Thank you for the opportunity to care for this patient.  Please do not hesitate to contact me with questions.  Althea Charon, FNP Allergy and [place] [facility] [place]

## 2021-12-28 ENCOUNTER — Ambulatory Visit: Payer: BC Managed Care – PPO | Admitting: Physician Assistant

## 2021-12-29 ENCOUNTER — Other Ambulatory Visit: Payer: Self-pay

## 2021-12-29 ENCOUNTER — Ambulatory Visit: Payer: BC Managed Care – PPO | Attending: Physician Assistant | Admitting: Physician Assistant

## 2021-12-29 DIAGNOSIS — R1012 Left upper quadrant pain: Secondary | ICD-10-CM

## 2021-12-29 DIAGNOSIS — K219 Gastro-esophageal reflux disease without esophagitis: Secondary | ICD-10-CM

## 2021-12-29 DIAGNOSIS — Z789 Other specified health status: Secondary | ICD-10-CM

## 2021-12-29 DIAGNOSIS — Z09 Encounter for follow-up examination after completed treatment for conditions other than malignant neoplasm: Secondary | ICD-10-CM

## 2021-12-29 MED ORDER — PANTOPRAZOLE SODIUM 40 MG PO TBEC: 40.0000 mg | DELAYED_RELEASE_TABLET | Freq: Every day | ORAL | 1 refills | Status: DC

## 2021-12-29 NOTE — Telephone Encounter (Signed)
Patient called in to discuss his FMLA paper work say that it should be for Asthma and Allergy and he completed the the allergy testing on 12/26/21 with Dr Selena Batten. So asking if all completed documents and his reports could be gathered and sent to [facility] ASAP. Also asking for a call back at  Ph# [phone]

## 2021-12-29 NOTE — Progress Notes (Signed)
Patient ID: Ernest Griffin, male   DOB: [birth year], 46 y.o.   MRN: [id number]  Virtual Visit via Telephone Note  I connected with [name] on 12/29/21 at  9:30 AM EST by telephone and verified that I am speaking with the correct person using two identifiers.  Location: Patient: home Provider: my home office   I discussed the limitations, risks, security and privacy concerns of performing an evaluation and management service by telephone and the availability of in person appointments. I also discussed with the patient that there may be a patient responsible charge related to this service. The patient expressed understanding and agreed to proceed.   History of Present Illness: Seen at ED 12/21/2021 for LUQ pain.  He is continuing to have the pain intermittently.  No vomiting.  No diarrhea.  Pain is intermittent and changes location.  No fever.  Appetite is poor.  All labs including lipase, CBC, CMP WNL 1 week ago.  Pain is mild to moderate.    From ED note: SUBJECTIVE: History from: patient. Arabic interpreter used. Ernest Griffin is a 46 y.o. male who presents with complaint of intermittent LUQ abdominal discomfort. Onset gradual,  approx 2 weeks ago . Discomfort described as aching and cramping that occasionally goes to back; very sporadic; does not wake him at night. Reports normal flatus. Symptoms are unchanged since beginning. Fever: absent. Aggravating factors: have not been identified. Alleviating factors: have not been identified. Associated symptoms: none reported. He denies anorexia, arthralgias, belching, chills, constipation, diarrhea, dysuria, fever, headache, melena, myalgias, nausea, sweats, and vomiting. Appetite: normal. PO intake: normal. Ambulatory without assistance. Urinary symptoms: none. Bowel movements: have not significantly changed; last bowel movement yest and without blood. History of similar:  denied . OTC treatment: none.   Observations/Objective:  NAD.   A&Ox3   Assessment and Plan: 1. Gastroesophageal reflux disease, unspecified whether esophagitis present - Ambulatory referral to Gastroenterology - pantoprazole (PROTONIX) 40 MG tablet; Take 1 tablet (40 mg total) by mouth daily.  Dispense: 90 tablet; Refill: 1  2. LUQ pain Now having pain intermittently all over abdomen.   - Ambulatory referral to Gastroenterology  3. Language barrier [place] interpreters used and additional time performing visit was required.  4. Encounter for examination following treatment at hospital Patient also wanting 3 months FMLA which he has been discussing with Dr Delford Field. He wants to see Dr Delford Field immediately as well    On the call for a long time bc the patient was asking for imaging/CT or U/S to be done TODAY and became demanding as such.  I explained to him that I am unable to order multiple imaging tests AND have them done today.  A specialist will better be able to determine what tests are appropriate To ED if worsens.  Patient verbalizes understanding  Follow Up Instructions: See Dr Delford Field ASAP per patient request to discuss FMLA   I discussed the assessment and treatment plan with the patient. The patient was provided an opportunity to ask questions and all were answered. The patient agreed with the plan and demonstrated an understanding of the instructions.   The patient was advised to call back or seek an in-person evaluation if the symptoms worsen or if the condition fails to improve as anticipated.  I provided 26 minutes of non-face-to-face time during this encounter.   Georgian Co, PA-C

## 2022-01-02 ENCOUNTER — Telehealth: Payer: Self-pay | Admitting: Critical Care Medicine

## 2022-01-02 NOTE — Progress Notes (Signed)
Established Patient Office Visit  Subjective:  Patient ID: Ernest Griffin, male    DOB: [birth year]  Age: 46 y.o. MRN: [id number]  CC:  Chief Complaint  Patient presents with   Medication Refill    HPI this visit was accomplished by Arabic interpreter Claudine [phone] on video Ernest Griffin presents for primary care follow-up.  Patient was last seen in November when he had a left lower extremity injury.  He subsequently got in with his orthopedist who did an MRI of the left lower extremity which showed a stress fracture of the tibia.  This healed on its own secondarily and the pain in the leg is now gone.  We filled out his FMLA paperwork in November it was my impression it was just for this leg injury however the patient wished to also include frequent absences that may occur as often as 1 time a week for his asthma.  He has significant allergic factors and has since been seen twice by allergy and asthma.  He was seen end of October and then again just this month.  Extensive testing has shown he has a severe shellfish allergy and also has allergies to environmental grasses trees and cats.  The patient states he will occasionally miss work on average over a 36-month interval 1 time a week because of this.  He works in a Acupuncturist and a Estate agent.  Patient also complains of left upper quadrant pain is been going on for about 6 weeks is progressively worse he has been to the emergency room twice for this.  No imaging is yet to occur.  He had a virtual visit with our PA last week and they ordered gastroenterology referral and Protonix however he never received the prescription for the Protonix.  When he went to the emergency room they did give him Carafate and he does have this medication.  He also notes the pain is gotten much worse in the left upper quadrant though he denies any nausea or vomiting.  He does not smoke or drink alcohol.  On arrival blood pressure is good 118/77.  He has been in  the [place] for 7 years immigrating from [place].  He denies any abdominal pain while in [place].  He has never been tested for H. pylori.  He does have a pending appoint with gastroenterology but has not yet to be scheduled.   Past Medical History:  Diagnosis Date   Asthma     History reviewed. No pertinent surgical history.  Family History  Problem Relation Age of Onset   Healthy Father    Allergic rhinitis Daughter     Social History   Socioeconomic History   Marital status: Married    Spouse name: Not on file   Number of children: Not on file   Years of education: Not on file   Highest education level: Not on file  Occupational History   Occupation: [employer]   Tobacco Use   Smoking status: Never   Smokeless tobacco: Never  Vaping Use   Vaping Use: Never used  Substance and Sexual Activity   Alcohol use: No    Alcohol/week: 0.0 standard drinks   Drug use: No   Sexual activity: Yes  Other Topics Concern   Not on file  Social History Narrative   Works at [employer]    From [place]    Social Determinants of Corporate investment banker Strain: Not on file  Food Insecurity: Not  on file  Transportation Needs: Not on file  Physical Activity: Not on file  Stress: Not on file  Social Connections: Not on file  Intimate Partner Violence: Not on file    Outpatient Medications Prior to Visit  Medication Sig Dispense Refill   Spacer/Aero-Holding Chambers (AEROCHAMBER PLUS) inhaler Use as instructed 1 each 2   albuterol (VENTOLIN HFA) 108 (90 Base) MCG/ACT inhaler Inhale 2 puffs into the lungs every 6 (six) hours as needed for wheezing or shortness of breath. Needs office visit 18 g 3   azelastine (ASTELIN) 0.1 % nasal spray Place 1-2 sprays into both nostrils 2 (two) times daily as needed (nasal drainage). Use in each nostril as directed 30 mL 5   budesonide-formoterol (SYMBICORT) 160-4.5 MCG/ACT inhaler Inhale 2 puffs into the lungs 2 (two) times daily. 1 each 6    cetirizine (ZYRTEC ALLERGY) 10 MG tablet Take 1 tablet (10 mg total) by mouth daily. 30 tablet 5   fluticasone (FLONASE) 50 MCG/ACT nasal spray Place 2 sprays into both nostrils 2 (two) times daily. Must have office visit for refills 16 g 1   olopatadine (PATADAY) 0.1 % ophthalmic solution Place 1 drop into both eyes 2 (two) times daily. 5 mL 4   sucralfate (CARAFATE) 1 g tablet Take 1 tablet (1 g total) by mouth 4 (four) times daily -  with meals and at bedtime. 28 tablet 0   EPINEPHrine 0.3 mg/0.3 mL IJ SOAJ injection Inject 0.3 mg into the muscle as needed for anaphylaxis. (Patient not taking: Reported on 01/03/2022) 1 each 2   ASPIRIN LOW DOSE 81 MG EC tablet Take 81 mg by mouth 2 (two) times daily. (Patient not taking: Reported on 12/26/2021)     fluticasone (FLONASE ALLERGY RELIEF) 50 MCG/ACT nasal spray Place 2 sprays into both nostrils as needed for allergies or rhinitis.     pantoprazole (PROTONIX) 40 MG tablet Take 1 tablet (40 mg total) by mouth daily. (Patient not taking: Reported on 01/03/2022) 90 tablet 1   No facility-administered medications prior to visit.    Allergies  Allergen Reactions   Caffeine    Ibuprofen     Pruritis    Strawberry (Diagnostic) Itching    ROS Review of Systems  Constitutional:  Negative for chills, diaphoresis and fever.  HENT:  Positive for postnasal drip and rhinorrhea. Negative for congestion, hearing loss, nosebleeds, sore throat, tinnitus, trouble swallowing and voice change.   Eyes:  Positive for itching. Negative for photophobia and redness.  Respiratory:  Positive for shortness of breath and wheezing. Negative for cough and stridor.   Cardiovascular:  Negative for chest pain, palpitations and leg swelling.  Gastrointestinal:  Positive for abdominal pain. Negative for abdominal distention, anal bleeding, blood in stool, constipation, diarrhea, nausea, rectal pain and vomiting.  Endocrine: Negative for polydipsia.  Genitourinary:  Negative  for dysuria, flank pain, frequency, hematuria and urgency.  Musculoskeletal:  Negative for back pain, myalgias and neck pain.  Skin:  Negative for rash.  Allergic/Immunologic: Negative for environmental allergies.  Neurological:  Negative for dizziness, tremors, seizures, weakness and headaches.  Hematological:  Does not bruise/bleed easily.  Psychiatric/Behavioral: Negative.  Negative for suicidal ideas. The patient is not nervous/anxious.      Objective:    Physical Exam Vitals reviewed.  Constitutional:      Appearance: Normal appearance. He is well-developed. He is not diaphoretic.  HENT:     Head: Normocephalic and atraumatic.     Nose: No nasal deformity, septal deviation,  mucosal edema or rhinorrhea.     Right Sinus: No maxillary sinus tenderness or frontal sinus tenderness.     Left Sinus: No maxillary sinus tenderness or frontal sinus tenderness.     Mouth/Throat:     Pharynx: No oropharyngeal exudate.  Eyes:     General: No scleral icterus.    Conjunctiva/sclera: Conjunctivae normal.     Pupils: Pupils are equal, round, and reactive to light.  Neck:     Thyroid: No thyromegaly.     Vascular: No carotid bruit or JVD.     Trachea: Trachea normal. No tracheal tenderness or tracheal deviation.  Cardiovascular:     Rate and Rhythm: Normal rate and regular rhythm.     Chest Wall: PMI is not displaced.     Pulses: Normal pulses. No decreased pulses.     Heart sounds: Normal heart sounds, S1 normal and S2 normal. Heart sounds not distant. No murmur heard. No systolic murmur is present.  No diastolic murmur is present.    No friction rub. No gallop. No S3 or S4 sounds.  Pulmonary:     Effort: Pulmonary effort is normal. No tachypnea, accessory muscle usage or respiratory distress.     Breath sounds: Normal breath sounds. No stridor. No decreased breath sounds, wheezing, rhonchi or rales.  Chest:     Chest wall: No tenderness.  Abdominal:     General: Abdomen is flat.  Bowel sounds are normal. There is no distension.     Palpations: Abdomen is soft. Abdomen is not rigid. There is no mass.     Tenderness: There is abdominal tenderness. There is guarding. There is no right CVA tenderness, left CVA tenderness or rebound.     Hernia: No hernia is present.     Comments: Abdomen is not distended there is left upper quadrant tenderness with guarding but no rebound no masses seen bowel sounds are normal  Musculoskeletal:        General: Normal range of motion.     Cervical back: Normal range of motion and neck supple. No edema, erythema or rigidity. No muscular tenderness. Normal range of motion.  Lymphadenopathy:     Head:     Right side of head: No submental or submandibular adenopathy.     Left side of head: No submental or submandibular adenopathy.     Cervical: No cervical adenopathy.  Skin:    General: Skin is warm and dry.     Coloration: Skin is not pale.     Findings: No rash.     Nails: There is no clubbing.  Neurological:     Mental Status: He is alert and oriented to person, place, and time.     Sensory: No sensory deficit.  Psychiatric:        Speech: Speech normal.        Behavior: Behavior normal.    BP 118/77    Pulse 82    Resp 16    Wt 184 lb 12.8 oz (83.8 kg)    SpO2 96%    BMI 27.29 kg/m  Wt Readings from Last 3 Encounters:  01/03/22 184 lb 12.8 oz (83.8 kg)  12/26/21 183 lb 6.4 oz (83.2 kg)  10/11/21 186 lb 3.2 oz (84.5 kg)     Health Maintenance Due  Topic Date Due   COLON CANCER SCREENING ANNUAL FOBT  Never done    There are no preventive care reminders to display for this patient.  No results found for: TSH Lab  Results  Component Value Date   WBC 5.2 12/21/2021   HGB 13.4 12/21/2021   HCT 38.8 (L) 12/21/2021   MCV 86.6 12/21/2021   PLT 239 12/21/2021   Lab Results  Component Value Date   NA 136 12/21/2021   K 4.1 12/21/2021   CO2 28 12/21/2021   GLUCOSE 88 12/21/2021   BUN 10 12/21/2021   CREATININE 1.08  12/21/2021   BILITOT 0.5 12/21/2021   ALKPHOS 79 12/21/2021   AST 25 12/21/2021   ALT 23 12/21/2021   PROT 7.3 12/21/2021   ALBUMIN 4.1 12/21/2021   CALCIUM 9.2 12/21/2021   ANIONGAP 4 (L) 12/21/2021   No results found for: CHOL No results found for: HDL No results found for: LDLCALC No results found for: TRIG No results found for: CHOLHDL Lab Results  Component Value Date   HGBA1C 5.60 01/17/2016      Assessment & Plan:   Problem List Items Addressed This Visit       Respiratory   Rhinitis, allergic (Chronic)    As per asthma assessment      Asthma, moderate persistent    Moderate persistent asthma stable at this time on Advair topical nasal steroid topical nasal antihistamine oral antihistamine  Patient aware of his needs for avoidance measures  I will refill out his FMLA paperwork to reflect that he may miss his much as 1 day week due to his asthma but hopefully this will be reduced with current plan of care per allergy      Relevant Medications   albuterol (VENTOLIN HFA) 108 (90 Base) MCG/ACT inhaler   budesonide-formoterol (SYMBICORT) 160-4.5 MCG/ACT inhaler   Chronic rhinitis    As per asthma assessment        Digestive   Gastroesophageal reflux disease    Patient with active reflux given reflux diet and will begin Protonix 40 mg twice daily before meals continue Carafate  Will obtain H. pylori breath test and make sure he gets his referral to gastroenterology likely will need upper endoscopy      Relevant Medications   pantoprazole (PROTONIX) 40 MG tablet   sucralfate (CARAFATE) 1 g tablet     Other   LUQ abdominal pain - Primary    Persistent left upper quadrant abdominal pain concerned about the possibility of gastric ulcer  We will check H. pylori breath test increase dose of proton pump inhibitor continue Carafate and Ensure referral to gastroenterology  Obtain CT scan of abdomen  Note lipase level normal when he was in the emergency room  this month other labs normal as well sublease will not be repeated      Relevant Orders   H. pylori breath test   CT ABDOMEN W WO CONTRAST   Colon cancer screening    We try to get a Cologuard kit for this patient but he could not understand the directions and the company was not helpful to him  I will issue a fecal occult kit and he will follow the more simpler directions to obtain screening      Relevant Orders   Fecal occult blood, imunochemical   RESOLVED: Injury of left leg    Traumatic injury left lower extremity was a stress fracture of the tibia this is now resolved      Other Visit Diagnoses     Mild intermittent asthma without complication       Relevant Medications   albuterol (VENTOLIN HFA) 108 (90 Base) MCG/ACT inhaler   budesonide-formoterol (SYMBICORT)  160-4.5 MCG/ACT inhaler       Meds ordered this encounter  Medications   albuterol (VENTOLIN HFA) 108 (90 Base) MCG/ACT inhaler    Sig: Inhale 2 puffs into the lungs every 6 (six) hours as needed for wheezing or shortness of breath. Needs office visit    Dispense:  18 g    Refill:  3   azelastine (ASTELIN) 0.1 % nasal spray    Sig: Place 1-2 sprays into both nostrils 2 (two) times daily as needed (nasal drainage). Use in each nostril as directed    Dispense:  30 mL    Refill:  5   budesonide-formoterol (SYMBICORT) 160-4.5 MCG/ACT inhaler    Sig: Inhale 2 puffs into the lungs 2 (two) times daily.    Dispense:  1 each    Refill:  6   cetirizine (ZYRTEC ALLERGY) 10 MG tablet    Sig: Take 1 tablet (10 mg total) by mouth daily.    Dispense:  30 tablet    Refill:  5   fluticasone (FLONASE) 50 MCG/ACT nasal spray    Sig: Place 2 sprays into both nostrils 2 (two) times daily. Must have office visit for refills    Dispense:  16 g    Refill:  1   olopatadine (PATADAY) 0.1 % ophthalmic solution    Sig: Place 1 drop into both eyes 2 (two) times daily.    Dispense:  5 mL    Refill:  4   pantoprazole (PROTONIX)  40 MG tablet    Sig: Take 1 tablet (40 mg total) by mouth 2 (two) times daily before a meal.    Dispense:  60 tablet    Refill:  2   sucralfate (CARAFATE) 1 g tablet    Sig: Take 1 tablet (1 g total) by mouth 4 (four) times daily -  with meals and at bedtime.    Dispense:  60 tablet    Refill:  2   36 minutes spent obtaining history and physical extra time needed because of need for language interpretation in Arabic complex decision making high risk of ER reutilization Follow-up: Return in about 2 months (around 03/03/2022).    Asencion Noble, MD

## 2022-01-02 NOTE — Telephone Encounter (Signed)
Copied from CRM [id number]. Topic: Quick Communication - Rx Refill/Question >> Dec 30, 2021  2:03 PM [name] wrote: Pt called to report that he is missing his medication, current status says transmission to pharmacy failed. Please advise  [id number] [place], [place] - [address] AT [facility] [address] [facility] [address] [place] [address] Phone: [phone] Fax: [phone]

## 2022-01-03 ENCOUNTER — Encounter: Payer: Self-pay | Admitting: Critical Care Medicine

## 2022-01-03 ENCOUNTER — Other Ambulatory Visit: Payer: Self-pay

## 2022-01-03 ENCOUNTER — Ambulatory Visit: Payer: BC Managed Care – PPO | Attending: Critical Care Medicine | Admitting: Critical Care Medicine

## 2022-01-03 VITALS — BP 118/77 | HR 82 | Resp 16 | Wt 184.8 lb

## 2022-01-03 DIAGNOSIS — J4520 Mild intermittent asthma, uncomplicated: Secondary | ICD-10-CM | POA: Diagnosis not present

## 2022-01-03 DIAGNOSIS — R1012 Left upper quadrant pain: Secondary | ICD-10-CM | POA: Diagnosis not present

## 2022-01-03 DIAGNOSIS — K219 Gastro-esophageal reflux disease without esophagitis: Secondary | ICD-10-CM | POA: Diagnosis not present

## 2022-01-03 DIAGNOSIS — S8992XS Unspecified injury of left lower leg, sequela: Secondary | ICD-10-CM

## 2022-01-03 DIAGNOSIS — J3089 Other allergic rhinitis: Secondary | ICD-10-CM

## 2022-01-03 DIAGNOSIS — J4541 Moderate persistent asthma with (acute) exacerbation: Secondary | ICD-10-CM | POA: Diagnosis not present

## 2022-01-03 DIAGNOSIS — J310 Chronic rhinitis: Secondary | ICD-10-CM

## 2022-01-03 DIAGNOSIS — Z1211 Encounter for screening for malignant neoplasm of colon: Secondary | ICD-10-CM

## 2022-01-03 MED ORDER — SUCRALFATE 1 G PO TABS: 1.0000 g | ORAL_TABLET | Freq: Three times a day (TID) | ORAL | 2 refills | Status: DC

## 2022-01-03 MED ORDER — ALBUTEROL SULFATE HFA 108 (90 BASE) MCG/ACT IN AERS: 2.0000 | INHALATION_SPRAY | Freq: Four times a day (QID) | RESPIRATORY_TRACT | 3 refills | Status: DC | PRN

## 2022-01-03 MED ORDER — AZELASTINE HCL 0.1 % NA SOLN: 1.0000 | Freq: Two times a day (BID) | NASAL | 5 refills | Status: DC | PRN

## 2022-01-03 MED ORDER — BUDESONIDE-FORMOTEROL FUMARATE 160-4.5 MCG/ACT IN AERO: 2.0000 | INHALATION_SPRAY | Freq: Two times a day (BID) | RESPIRATORY_TRACT | 6 refills | Status: DC

## 2022-01-03 MED ORDER — PANTOPRAZOLE SODIUM 40 MG PO TBEC: 40.0000 mg | DELAYED_RELEASE_TABLET | Freq: Two times a day (BID) | ORAL | 2 refills | Status: DC

## 2022-01-03 MED ORDER — OLOPATADINE HCL 0.1 % OP SOLN: 1.0000 [drp] | Freq: Two times a day (BID) | OPHTHALMIC | 4 refills | Status: DC

## 2022-01-03 MED ORDER — CETIRIZINE HCL 10 MG PO TABS: 10.0000 mg | ORAL_TABLET | Freq: Every day | ORAL | 5 refills | Status: DC

## 2022-01-03 MED ORDER — FLUTICASONE PROPIONATE 50 MCG/ACT NA SUSP: 2.0000 | Freq: Two times a day (BID) | NASAL | 1 refills | Status: DC

## 2022-01-03 NOTE — Telephone Encounter (Signed)
Spoke with pt yesterday and we decided since he does have  an appointment today we will discus what he is missing.

## 2022-01-03 NOTE — Assessment & Plan Note (Signed)
Moderate persistent asthma stable at this time on Advair topical nasal steroid topical nasal antihistamine oral antihistamine  Patient aware of his needs for avoidance measures  I will refill out his FMLA paperwork to reflect that he may miss his much as 1 day week due to his asthma but hopefully this will be reduced with current plan of care per allergy

## 2022-01-03 NOTE — Assessment & Plan Note (Signed)
Traumatic injury left lower extremity was a stress fracture of the tibia this is now resolved

## 2022-01-03 NOTE — Assessment & Plan Note (Signed)
We try to get a Cologuard kit for this patient but he could not understand the directions and the company was not helpful to him ° °I will issue a fecal occult kit and he will follow the more simpler directions to obtain screening °

## 2022-01-03 NOTE — Telephone Encounter (Signed)
Called [employer] and number on the sheet, couldn't get anything/anyone. Called pt and left vm to see if they will send [place] over a blank FMLA

## 2022-01-03 NOTE — Assessment & Plan Note (Signed)
Patient with active reflux given reflux diet and will begin Protonix 40 mg twice daily before meals continue Carafate  Will obtain H. pylori breath test and make sure he gets his referral to gastroenterology likely will need upper endoscopy

## 2022-01-03 NOTE — Patient Instructions (Signed)
CT Abdomen will be obtained  Refills on all medications were sent to pharmacy  Labs : H Pylori breath test, colon cancer screen kit issued  Referral to gastroenterology was made  Take protonix twice daily before meals and carafate 4 times a day  We will re process FMLA paperwork  Return Dr Joya Gaskins 2 months

## 2022-01-03 NOTE — Assessment & Plan Note (Addendum)
Persistent left upper quadrant abdominal pain concerned about the possibility of gastric ulcer  We will check H. pylori breath test increase dose of proton pump inhibitor continue Carafate and Ensure referral to gastroenterology  Obtain CT scan of abdomen  Note lipase level normal when he was in the emergency room this month other labs normal as well sublease will not be repeated

## 2022-01-03 NOTE — Assessment & Plan Note (Signed)
As per asthma assessment °

## 2022-01-04 LAB — H. PYLORI BREATH TEST: H pylori Breath Test: NEGATIVE

## 2022-01-06 ENCOUNTER — Telehealth: Payer: Self-pay

## 2022-01-06 NOTE — Telephone Encounter (Signed)
Pt was called and vm was left, Information has been sent to nurse pool.   Interpreter [id number]

## 2022-01-09 ENCOUNTER — Ambulatory Visit (HOSPITAL_COMMUNITY): Payer: BC Managed Care – PPO

## 2022-02-24 NOTE — Patient Instructions (Incomplete)
Asthma:  ?Daily controller medication(s): Start Symbicort 2 puffs twice a day with spacer and rinse mouth afterwards with spacer and rinse mouth afterwards.  Make sure and use this every day ?May use albuterol rescue inhaler 2 puffs every 4 to 6 hours as needed for shortness of breath, chest tightness, coughing, and wheezing. May use albuterol rescue inhaler 2 puffs 5 to 15 minutes prior to strenuous physical activities. Monitor frequency of use.  ?Asthma control goals:  ?Full participation in all desired activities (may need albuterol before activity) ?Albuterol use two times or less a week on average (not counting use with activity) ?Cough interfering with sleep two times or less a month ?Oral steroids no more than once a year ?No hospitalizations  ? ?Food allergy ?Continue strict avoidance of fish, shellfish, peanuts, tree nuts, strawberries, eggplant, and legumes.  He is able to eat tuna without any problems in case of an allergic reaction, give Benadryl 4 teaspoonfuls every 4 hours, and if life-threatening symptoms occur, inject with EpiPen 0.3 mg. ?Follow emergency action plan given at last office visit. ?We will get lab work to follow up on your food allergies. We will call you with results once they are all back ? ?Seasonal and perennial allergic rhinitis ?Skin testing is positive today to dust mite, grass pollen, tree mix, cat, and [place] cockroach ?Use Flonase (fluticasone) nasal spray 1 spray per nostril twice a day as needed for nasal congestion.  ?Use azelastine nasal spray 1-2 sprays per nostril twice a day as needed for runny nose/drainage. ?Use over the counter antihistamines such as Zyrtec (cetirizine) daily as needed.  ?Start avoidance measures as below. ? ?Gastroesophageal reflux disease ?Continue lifestyle and dietary modifications.  Follow-up with your primary care physician regarding the medications you are supposed to be taking. ? ?Recommend getting an eye exam as recommended at your  last office visit. ? ?Please let us know if this treatment plan is not working well for you. ?Schedule a follow up appointment in months or sooner if needed ? ?Control of Dust Mite Allergen ?Dust mites play a major role in allergic asthma and rhinitis. They occur in environments with high humidity wherever human skin is found. Dust mites absorb humidity from the atmosphere (ie, they do not drink) and feed on organic matter (including shed human and animal skin). Dust mites are a microscopic type of insect that you cannot see with the naked eye. High levels of dust mites have been detected from mattresses, pillows, carpets, upholstered furniture, bed covers, clothes, soft toys and any woven material. The principal allergen of the dust mite is found in its feces. A gram of dust may contain 1,000 mites and 250,000 fecal particles. Mite antigen is easily measured in the air during house cleaning activities. Dust mites do not bite and do not cause harm to humans, other than by triggering allergies/asthma. ? ?Ways to decrease your exposure to dust mites in your home: ? ?1. Encase mattresses, box springs and pillows with a mite-impermeable barrier or cover ? ?2. Wash sheets, blankets and drapes weekly in hot water (130? F) with detergent and dry them in a dryer on the hot setting. ? ?3. Have the room cleaned frequently with a vacuum cleaner and a damp dust-mop. For carpeting or rugs, vacuuming with a vacuum cleaner equipped with a high-efficiency particulate air (HEPA) filter. The dust mite allergic individual should not be in a room which is being cleaned and should wait 1 hour after cleaning before going into  the room. ? ?4. Do not sleep on upholstered furniture (eg, couches). ? ?5. If possible removing carpeting, upholstered furniture and drapery from the home is ideal. Horizontal blinds should be eliminated in the rooms where the person spends the most time (bedroom, study, television room). Washable vinyl, roller-type  shades are optimal. ? ?6. Remove all non-washable stuffed toys from the bedroom. Wash stuffed toys weekly like sheets and blankets above. ? ?7. Reduce indoor humidity to less than 50%. Inexpensive humidity monitors can be purchased at most hardware stores. Do not use a humidifier as can make the problem worse and are not recommended. ? ?Reducing Pollen Exposure ?The American Academy of Allergy, Asthma and Immunology suggests the following steps to reduce your exposure to pollen during allergy seasons. ?Do not hang sheets or clothing out to dry; pollen may collect on these items. ?Do not mow lawns or spend time around freshly cut grass; mowing stirs up pollen. ?Keep windows closed at night.  Keep car windows closed while driving. ?Minimize morning activities outdoors, a time when pollen counts are usually at their highest. ?Stay indoors as much as possible when pollen counts or humidity is high and on windy days when pollen tends to remain in the air longer. ?Use air conditioning when possible.  Many air conditioners have filters that trap the pollen spores. ?Use a HEPA room air filter to remove pollen form the indoor air you breathe. ? ?Control of Dog or Cat Allergen ?Avoidance is the best way to manage a dog or cat allergy. If you have a dog or cat and are allergic to dog or cats, consider removing the dog or cat from the home. ?If you have a dog or cat but don?t want to find it a new home, or if your family wants a pet even though someone in the household is allergic, here are some strategies that may help keep symptoms at bay: ? ?Keep the pet out of your bedroom and restrict it to only a few rooms. Be advised that keeping the dog or cat in only one room will not limit the allergens to that room. ?Don?t pet, hug or kiss the dog or cat; if you do, wash your hands with soap and water. ?High-efficiency particulate air (HEPA) cleaners run continuously in a bedroom or living room can reduce allergen levels over  time. ?Regular use of a high-efficiency vacuum cleaner or a central vacuum can reduce allergen levels. ?Giving your dog or cat a bath at least once a week can reduce airborne allergen. ? ?Control of Cockroach Allergen ? ?Cockroach allergen has been identified as an important cause of acute attacks of asthma, especially in urban settings.  There are fifty-five species of cockroach that exist in the [place], however only three, the [place], [place] species produce allergen that can affect patients with Asthma.  Allergens can be obtained from fecal particles, egg casings and secretions from cockroaches. ?   ?Remove food sources. ?Reduce access to water. ?Seal access and entry points. ?Spray runways with 0.5-1% Diazinon or Chlorpyrifos ?Blow boric acid power under stoves and refrigerator. ?Place bait stations (hydramethylnon) at feeding sites. ? ? ? ?

## 2022-02-27 ENCOUNTER — Ambulatory Visit: Payer: BC Managed Care – PPO | Admitting: Family

## 2022-04-01 NOTE — Patient Instructions (Incomplete)
Asthma:  ?Daily controller medication(s): Continue Symbicort 2 puffs twice a day with spacer and rinse mouth afterwards with spacer and rinse mouth afterwards.   ?May use albuterol rescue inhaler 2 puffs every 4 to 6 hours as needed for shortness of breath, chest tightness, coughing, and wheezing. May use albuterol rescue inhaler 2 puffs 5 to 15 minutes prior to strenuous physical activities. Monitor frequency of use.  ?Asthma control goals:  ?Full participation in all desired activities (may need albuterol before activity) ?Albuterol use two times or less a week on average (not counting use with activity) ?Cough interfering with sleep two times or less a month ?Oral steroids no more than once a year ?No hospitalizations  ? ?Food allergy ?Continue strict avoidance of fish, shellfish, peanuts, tree nuts, strawberries, eggplant, and legumes.  He is able to eat tuna without any problems. In case of an allergic reaction, give Benadryl 4 teaspoonfuls every 4 hours, and if life-threatening symptoms occur, inject with EpiPen 0.3 mg. ?Follow emergency action plan given at last office visit. ?We will get lab work to follow up on your food allergies. We will call you with results once they are all back ? ?Seasonal and perennial allergic rhinitis ?Skin testing was positive on 12/26/21 to dust mite, grass pollen, tree mix, cat, and [place] cockroach ?Use Flonase (fluticasone) nasal spray 1 spray per nostril twice a day as needed for nasal congestion.  ?Use azelastine nasal spray 1-2 sprays per nostril twice a day as needed for runny nose/drainage. ?Use over the counter antihistamines such as Zyrtec (cetirizine) daily as needed.  ?Start avoidance measures as below. ? ?Gastroesophageal reflux disease ?Continue lifestyle and dietary modifications.  Follow-up with your primary care physician regarding the medications you are supposed to be taking. ? ?Recommend getting an eye exam as recommended at your last office  visit. ? ?Please let us know if this treatment plan is not working well for you. ?Schedule a follow up appointment in 2 months or sooner if needed ? ?Control of Dust Mite Allergen ?Dust mites play a major role in allergic asthma and rhinitis. They occur in environments with high humidity wherever human skin is found. Dust mites absorb humidity from the atmosphere (ie, they do not drink) and feed on organic matter (including shed human and animal skin). Dust mites are a microscopic type of insect that you cannot see with the naked eye. High levels of dust mites have been detected from mattresses, pillows, carpets, upholstered furniture, bed covers, clothes, soft toys and any woven material. The principal allergen of the dust mite is found in its feces. A gram of dust may contain 1,000 mites and 250,000 fecal particles. Mite antigen is easily measured in the air during house cleaning activities. Dust mites do not bite and do not cause harm to humans, other than by triggering allergies/asthma. ? ?Ways to decrease your exposure to dust mites in your home: ? ?1. Encase mattresses, box springs and pillows with a mite-impermeable barrier or cover ? ?2. Wash sheets, blankets and drapes weekly in hot water (130? F) with detergent and dry them in a dryer on the hot setting. ? ?3. Have the room cleaned frequently with a vacuum cleaner and a damp dust-mop. For carpeting or rugs, vacuuming with a vacuum cleaner equipped with a high-efficiency particulate air (HEPA) filter. The dust mite allergic individual should not be in a room which is being cleaned and should wait 1 hour after cleaning before going into the room. ? ?4.  Do not sleep on upholstered furniture (eg, couches). ? ?5. If possible removing carpeting, upholstered furniture and drapery from the home is ideal. Horizontal blinds should be eliminated in the rooms where the person spends the most time (bedroom, study, television room). Washable vinyl, roller-type shades  are optimal. ? ?6. Remove all non-washable stuffed toys from the bedroom. Wash stuffed toys weekly like sheets and blankets above. ? ?7. Reduce indoor humidity to less than 50%. Inexpensive humidity monitors can be purchased at most hardware stores. Do not use a humidifier as can make the problem worse and are not recommended. ? ?Reducing Pollen Exposure ?The American Academy of Allergy, Asthma and Immunology suggests the following steps to reduce your exposure to pollen during allergy seasons. ?Do not hang sheets or clothing out to dry; pollen may collect on these items. ?Do not mow lawns or spend time around freshly cut grass; mowing stirs up pollen. ?Keep windows closed at night.  Keep car windows closed while driving. ?Minimize morning activities outdoors, a time when pollen counts are usually at their highest. ?Stay indoors as much as possible when pollen counts or humidity is high and on windy days when pollen tends to remain in the air longer. ?Use air conditioning when possible.  Many air conditioners have filters that trap the pollen spores. ?Use a HEPA room air filter to remove pollen form the indoor air you breathe. ? ?Control of Dog or Cat Allergen ?Avoidance is the best way to manage a dog or cat allergy. If you have a dog or cat and are allergic to dog or cats, consider removing the dog or cat from the home. ?If you have a dog or cat but don?t want to find it a new home, or if your family wants a pet even though someone in the household is allergic, here are some strategies that may help keep symptoms at bay: ? ?Keep the pet out of your bedroom and restrict it to only a few rooms. Be advised that keeping the dog or cat in only one room will not limit the allergens to that room. ?Don?t pet, hug or kiss the dog or cat; if you do, wash your hands with soap and water. ?High-efficiency particulate air (HEPA) cleaners run continuously in a bedroom or living room can reduce allergen levels over time. ?Regular  use of a high-efficiency vacuum cleaner or a central vacuum can reduce allergen levels. ?Giving your dog or cat a bath at least once a week can reduce airborne allergen. ? ?Control of Cockroach Allergen ? ?Cockroach allergen has been identified as an important cause of acute attacks of asthma, especially in urban settings.  There are fifty-five species of cockroach that exist in the [place], however only three, the [place], [place] species produce allergen that can affect patients with Asthma.  Allergens can be obtained from fecal particles, egg casings and secretions from cockroaches. ?   ?Remove food sources. ?Reduce access to water. ?Seal access and entry points. ?Spray runways with 0.5-1% Diazinon or Chlorpyrifos ?Blow boric acid power under stoves and refrigerator. ?Place bait stations (hydramethylnon) at feeding sites. ? ? ? ?

## 2022-04-03 ENCOUNTER — Ambulatory Visit: Payer: BC Managed Care – PPO | Admitting: Family

## 2022-05-11 ENCOUNTER — Ambulatory Visit: Payer: BC Managed Care – PPO | Admitting: Critical Care Medicine

## 2022-06-07 NOTE — Patient Instructions (Incomplete)
Asthma: controlled today  Daily controller medication(s): Continue Symbicort 2 puffs twice a day with spacer and rinse mouth afterwards with spacer and rinse mouth afterwards.  Make sure and use this every day May use albuterol rescue inhaler 2 puffs every 4 to 6 hours as needed for shortness of breath, chest tightness, coughing, and wheezing. May use albuterol rescue inhaler 2 puffs 5 to 15 minutes prior to strenuous physical activities. Monitor frequency of use.  Asthma control goals:  Full participation in all desired activities (may need albuterol before activity) Albuterol use two times or less a week on average (not counting use with activity) Cough interfering with sleep two times or less a month Oral steroids no more than once a year No hospitalizations   Food allergy Continue strict avoidance of fish, shellfish, peanuts, tree nuts, strawberries, eggplant, and legumes.  He is able to eat tuna without any problems in case of an allergic reaction, give Benadryl 4 teaspoonfuls every 4 hours, and if life-threatening symptoms occur, inject with EpiPen 0.3 mg. Follow emergency action plan given at previous visits. Get the lab work that was ordered at your last office visit to follow up on your food allergies. We will call you with results once they are all back  Seasonal and perennial allergic rhinitis Skin testing on 12/26/21 was positive today to dust mite, grass pollen, tree mix, cat, and [place] cockroach Use Flonase (fluticasone) nasal spray 1 spray per nostril twice a day as needed for nasal congestion.  Use azelastine nasal spray 1-2 sprays per nostril twice a day as needed for runny nose/drainage. Use over the counter antihistamines such as Zyrtec (cetirizine) daily as needed.  Start avoidance measures as below.  Gastroesophageal reflux disease Continue lifestyle and dietary modifications.  Follow-up with your primary care physician regarding the medications you are supposed to  be taking.  Rash-off/on Recommend taking your Zyrtec (cetirizine) daily to see if this helps Consider lab work in the future if this continue  Please let us know if this treatment plan is not working well for you. Schedule a follow up appointment in 3 to 4 months months or sooner if needed  Control of Dust Mite Allergen Dust mites play a major role in allergic asthma and rhinitis. They occur in environments with high humidity wherever human skin is found. Dust mites absorb humidity from the atmosphere (ie, they do not drink) and feed on organic matter (including shed human and animal skin). Dust mites are a microscopic type of insect that you cannot see with the naked eye. High levels of dust mites have been detected from mattresses, pillows, carpets, upholstered furniture, bed covers, clothes, soft toys and any woven material. The principal allergen of the dust mite is found in its feces. A gram of dust may contain 1,000 mites and 250,000 fecal particles. Mite antigen is easily measured in the air during house cleaning activities. Dust mites do not bite and do not cause harm to humans, other than by triggering allergies/asthma.  Ways to decrease your exposure to dust mites in your home:  1. Encase mattresses, box springs and pillows with a mite-impermeable barrier or cover  2. Wash sheets, blankets and drapes weekly in hot water (130 F) with detergent and dry them in a dryer on the hot setting.  3. Have the room cleaned frequently with a vacuum cleaner and a damp dust-mop. For carpeting or rugs, vacuuming with a vacuum cleaner equipped with a high-efficiency particulate air (HEPA) filter. The dust  mite allergic individual should not be in a room which is being cleaned and should wait 1 hour after cleaning before going into the room.  4. Do not sleep on upholstered furniture (eg, couches).  5. If possible removing carpeting, upholstered furniture and drapery from the home is ideal. Horizontal  blinds should be eliminated in the rooms where the person spends the most time (bedroom, study, television room). Washable vinyl, roller-type shades are optimal.  6. Remove all non-washable stuffed toys from the bedroom. Wash stuffed toys weekly like sheets and blankets above.  7. Reduce indoor humidity to less than 50%. Inexpensive humidity monitors can be purchased at most hardware stores. Do not use a humidifier as can make the problem worse and are not recommended.  Reducing Pollen Exposure The American Academy of Allergy, Asthma and Immunology suggests the following steps to reduce your exposure to pollen during allergy seasons. Do not hang sheets or clothing out to dry; pollen may collect on these items. Do not mow lawns or spend time around freshly cut grass; mowing stirs up pollen. Keep windows closed at night.  Keep car windows closed while driving. Minimize morning activities outdoors, a time when pollen counts are usually at their highest. Stay indoors as much as possible when pollen counts or humidity is high and on windy days when pollen tends to remain in the air longer. Use air conditioning when possible.  Many air conditioners have filters that trap the pollen spores. Use a HEPA room air filter to remove pollen form the indoor air you breathe.  Control of Dog or Cat Allergen Avoidance is the best way to manage a dog or cat allergy. If you have a dog or cat and are allergic to dog or cats, consider removing the dog or cat from the home. If you have a dog or cat but don't want to find it a new home, or if your family wants a pet even though someone in the household is allergic, here are some strategies that may help keep symptoms at bay:  Keep the pet out of your bedroom and restrict it to only a few rooms. Be advised that keeping the dog or cat in only one room will not limit the allergens to that room. Don't pet, hug or kiss the dog or cat; if you do, wash your hands with soap  and water. High-efficiency particulate air (HEPA) cleaners run continuously in a bedroom or living room can reduce allergen levels over time. Regular use of a high-efficiency vacuum cleaner or a central vacuum can reduce allergen levels. Giving your dog or cat a bath at least once a week can reduce airborne allergen.  Control of Cockroach Allergen  Cockroach allergen has been identified as an important cause of acute attacks of asthma, especially in urban settings.  There are fifty-five species of cockroach that exist in the [place], however only three, the [place], [place] species produce allergen that can affect patients with Asthma.  Allergens can be obtained from fecal particles, egg casings and secretions from cockroaches.    Remove food sources. Reduce access to water. Seal access and entry points. Spray runways with 0.5-1% Diazinon or Chlorpyrifos Blow boric acid power under stoves and refrigerator. Place bait stations (hydramethylnon) at feeding sites.

## 2022-06-08 ENCOUNTER — Ambulatory Visit (INDEPENDENT_AMBULATORY_CARE_PROVIDER_SITE_OTHER): Payer: BC Managed Care – PPO | Admitting: Family

## 2022-06-08 ENCOUNTER — Encounter: Payer: Self-pay | Admitting: Family

## 2022-06-08 ENCOUNTER — Encounter: Payer: Self-pay | Admitting: Allergy

## 2022-06-08 VITALS — BP 120/70 | HR 85 | Temp 97.3°F | Resp 16 | Wt 179.6 lb

## 2022-06-08 DIAGNOSIS — K219 Gastro-esophageal reflux disease without esophagitis: Secondary | ICD-10-CM

## 2022-06-08 DIAGNOSIS — J3089 Other allergic rhinitis: Secondary | ICD-10-CM | POA: Diagnosis not present

## 2022-06-08 DIAGNOSIS — J302 Other seasonal allergic rhinitis: Secondary | ICD-10-CM

## 2022-06-08 DIAGNOSIS — J4540 Moderate persistent asthma, uncomplicated: Secondary | ICD-10-CM | POA: Diagnosis not present

## 2022-06-08 DIAGNOSIS — T7800XD Anaphylactic reaction due to unspecified food, subsequent encounter: Secondary | ICD-10-CM

## 2022-06-08 MED ORDER — BUDESONIDE-FORMOTEROL FUMARATE 160-4.5 MCG/ACT IN AERO: 2.0000 | INHALATION_SPRAY | Freq: Two times a day (BID) | RESPIRATORY_TRACT | 5 refills | Status: DC

## 2022-06-08 MED ORDER — ALBUTEROL SULFATE HFA 108 (90 BASE) MCG/ACT IN AERS: 2.0000 | INHALATION_SPRAY | Freq: Four times a day (QID) | RESPIRATORY_TRACT | 1 refills | Status: DC | PRN

## 2022-06-08 NOTE — Progress Notes (Signed)
[address] [name] [address] Dept: [phone]  FOLLOW UP NOTE  Patient ID: Ernest Griffin, male    DOB: 07-12-1976  Age: 46 y.o. MRN: [id number] Date of Office Visit: 06/08/2022  Assessment  Chief Complaint: Follow-up (Itching on elbows for 3 days, nose stuffy and bleeding, Asthma is good from time to time)  HPI Ernest Griffin is a 46 year old male who presents today for follow-up of not well controlled asthma without complication, seasonal and perennial allergic rhinitis, anaphylactic shock due to food, and gastroesophageal reflux disease.  He was last seen on December 26, 2021 by myself.  He denies any new diagnosis or surgery since his last office visit.  An interpreter is here with him today and helps provide history.  Asthma: He reports that his breathing is better.  He is currently taking Symbicort 160/4.5 mcg 2 puffs twice a day with a spacer and has albuterol to use as needed.  He will sometimes have cough, wheeze, shortness of breath, tightness in chest, and nocturnal awakenings due to breathing problems.  Since his last office visit he has not required any systemic steroids or made any trips to the emergency room or urgent care due to breathing problems.  He uses his albuterol inhaler sometimes once a month or every 2 months.  He continues to avoid fish, shellfish, peanuts, tree nuts, strawberries, eggplant, and legumes.  Since his last office visit he has not had to use his epinephrine autoinjector device.  He reports that he is able to eat tuna without any problems.  Seasonal and perennial allergic rhinitis: He reports nasal congestion and denies rhinorrhea and postnasal drip.  He has not had any sinus infections since we last saw him.  He uses fluticasone nasal spray as needed, azelastine nasal spray as needed, and Zyrtec as needed.  When his allergy flares he reports at times that he will get an itchy rash on his arms.  This comes and goes.  He has not started any new  medications and is not eating any new foods.  He is also not using any new products.  He reports that his daughter will also get this kind of rash from time to time with her allergies.  He has not had any recent bug bites and was not sick prior to this itchy rash occurring.  Gastroesophageal reflux disease: He denies any heartburn or reflux symptoms.  He stopped taking the Protonix that was prescribed by his primary care physician.   Drug Allergies:  Allergies  Allergen Reactions   Caffeine    Ibuprofen     Pruritis    Strawberry (Diagnostic) Itching    Review of Systems: Review of Systems  Constitutional:  Negative for chills and fever.  HENT:         Reports nasal congestion and denies rhinorrhea and postnasal drip  Eyes:        Denies itchy watery eyes  Respiratory:  Positive for cough, shortness of breath and wheezing.        Reports that he sometimes has cough, wheeze, tightness in chest, shortness of breath, and nocturnal awakenings due to breathing problems  Cardiovascular:  Negative for chest pain and palpitations.  Gastrointestinal:        Denies heartburn or reflux symptoms  Genitourinary:  Negative for frequency.  Skin:  Positive for itching and rash.       Reports itchy rash that started on his elbows/arms 3 days ago.  This usually occurs with  allergy flares.  Neurological:  Positive for headaches.  Endo/Heme/Allergies:  Positive for environmental allergies.     Physical Exam: BP 120/70   Pulse 85   Temp (!) 97.3 F (36.3 C) (Temporal)   Resp 16   Wt 179 lb 9.6 oz (81.5 kg)   SpO2 98%   BMI 26.52 kg/m    Physical Exam Exam conducted with a chaperone present.  Constitutional:      Appearance: Normal appearance.  HENT:     Head: Normocephalic and atraumatic.     Comments: Pharynx normal. Eyes normal. Ears normal. Nose: bilateral lower turbinates mildly edematous with no drainage noted    Right Ear: Tympanic membrane, ear canal and external ear normal.      Left Ear: Tympanic membrane, ear canal and external ear normal.     Mouth/Throat:     Mouth: Mucous membranes are moist.     Pharynx: Oropharynx is clear.  Eyes:     Conjunctiva/sclera: Conjunctivae normal.  Cardiovascular:     Rate and Rhythm: Normal rate and regular rhythm.     Heart sounds: Normal heart sounds.  Pulmonary:     Effort: Pulmonary effort is normal.     Breath sounds: Normal breath sounds.     Comments: Lungs clear to auscultation Musculoskeletal:     Cervical back: Neck supple.  Skin:    General: Skin is warm.     Comments:  Small fine flesh colored papular lesions noted on bilateral elbow region  Neurological:     Mental Status: He is alert and oriented to person, place, and time.  Psychiatric:        Mood and Affect: Mood normal.        Behavior: Behavior normal.        Thought Content: Thought content normal.        Judgment: Judgment normal.     Diagnostics: FVC 3.56 L (80%), FEV1 2.92 L (81%).  Predicted FVC 4.47 L, predicted FEV1 3.59 L.  Spirometry indicates normal respiratory function.  Assessment and Plan: 1. Moderate persistent asthma without complication   2. Seasonal and perennial allergic rhinitis   3. Anaphylactic shock due to food, subsequent encounter   4. Gastroesophageal reflux disease, unspecified whether esophagitis present     Meds ordered this encounter  Medications   albuterol (VENTOLIN HFA) 108 (90 Base) MCG/ACT inhaler    Sig: Inhale 2 puffs into the lungs every 6 (six) hours as needed for wheezing or shortness of breath. Needs office visit    Dispense:  18 g    Refill:  1   budesonide-formoterol (SYMBICORT) 160-4.5 MCG/ACT inhaler    Sig: Inhale 2 puffs into the lungs 2 (two) times daily.    Dispense:  10.2 g    Refill:  5    Patient Instructions  Asthma: controlled today  Daily controller medication(s): Continue Symbicort 152mcg 2 puffs twice a day with spacer and rinse mouth afterwards with spacer and rinse mouth  afterwards.  Make sure and use this every day May use albuterol rescue inhaler 2 puffs every 4 to 6 hours as needed for shortness of breath, chest tightness, coughing, and wheezing. May use albuterol rescue inhaler 2 puffs 5 to 15 minutes prior to strenuous physical activities. Monitor frequency of use.  Asthma control goals:  Full participation in all desired activities (may need albuterol before activity) Albuterol use two times or less a week on average (not counting use with activity) Cough interfering with sleep two times  or less a month Oral steroids no more than once a year No hospitalizations   Food allergy Continue strict avoidance of fish, shellfish, peanuts, tree nuts, strawberries, eggplant, and legumes.  He is able to eat tuna without any problems in case of an allergic reaction, give Benadryl 4 teaspoonfuls every 4 hours, and if life-threatening symptoms occur, inject with EpiPen 0.3 mg. Follow emergency action plan given at previous visits. Get the lab work that was ordered at your last office visit to follow up on your food allergies. We will call you with results once they are all back  Seasonal and perennial allergic rhinitis Skin testing on 12/26/21 was positive today to dust mite, grass pollen, tree mix, cat, and [place] cockroach Use Flonase (fluticasone) nasal spray 1 spray per nostril twice a day as needed for nasal congestion.  Use azelastine nasal spray 1-2 sprays per nostril twice a day as needed for runny nose/drainage. Use over the counter antihistamines such as Zyrtec (cetirizine) daily as needed.  Start avoidance measures as below.  Gastroesophageal reflux disease Continue lifestyle and dietary modifications.  Follow-up with your primary care physician regarding the medications you are supposed to be taking.  Rash-off/on Recommend taking your Zyrtec (cetirizine) daily to see if this helps Consider lab work in the future if this continue  Please let us know if  this treatment plan is not working well for you. Schedule a follow up appointment in 3 to 4 months months or sooner if needed  Control of Dust Mite Allergen Dust mites play a major role in allergic asthma and rhinitis. They occur in environments with high humidity wherever human skin is found. Dust mites absorb humidity from the atmosphere (ie, they do not drink) and feed on organic matter (including shed human and animal skin). Dust mites are a microscopic type of insect that you cannot see with the naked eye. High levels of dust mites have been detected from mattresses, pillows, carpets, upholstered furniture, bed covers, clothes, soft toys and any woven material. The principal allergen of the dust mite is found in its feces. A gram of dust may contain 1,000 mites and 250,000 fecal particles. Mite antigen is easily measured in the air during house cleaning activities. Dust mites do not bite and do not cause harm to humans, other than by triggering allergies/asthma.  Ways to decrease your exposure to dust mites in your home:  1. Encase mattresses, box springs and pillows with a mite-impermeable barrier or cover  2. Wash sheets, blankets and drapes weekly in hot water (130 F) with detergent and dry them in a dryer on the hot setting.  3. Have the room cleaned frequently with a vacuum cleaner and a damp dust-mop. For carpeting or rugs, vacuuming with a vacuum cleaner equipped with a high-efficiency particulate air (HEPA) filter. The dust mite allergic individual should not be in a room which is being cleaned and should wait 1 hour after cleaning before going into the room.  4. Do not sleep on upholstered furniture (eg, couches).  5. If possible removing carpeting, upholstered furniture and drapery from the home is ideal. Horizontal blinds should be eliminated in the rooms where the person spends the most time (bedroom, study, television room). Washable vinyl, roller-type shades are optimal.  6.  Remove all non-washable stuffed toys from the bedroom. Wash stuffed toys weekly like sheets and blankets above.  7. Reduce indoor humidity to less than 50%. Inexpensive humidity monitors can be purchased at most hardware stores. Do  not use a humidifier as can make the problem worse and are not recommended.  Reducing Pollen Exposure The American Academy of Allergy, Asthma and Immunology suggests the following steps to reduce your exposure to pollen during allergy seasons. Do not hang sheets or clothing out to dry; pollen may collect on these items. Do not mow lawns or spend time around freshly cut grass; mowing stirs up pollen. Keep windows closed at night.  Keep car windows closed while driving. Minimize morning activities outdoors, a time when pollen counts are usually at their highest. Stay indoors as much as possible when pollen counts or humidity is high and on windy days when pollen tends to remain in the air longer. Use air conditioning when possible.  Many air conditioners have filters that trap the pollen spores. Use a HEPA room air filter to remove pollen form the indoor air you breathe.  Control of Dog or Cat Allergen Avoidance is the best way to manage a dog or cat allergy. If you have a dog or cat and are allergic to dog or cats, consider removing the dog or cat from the home. If you have a dog or cat but don't want to find it a new home, or if your family wants a pet even though someone in the household is allergic, here are some strategies that may help keep symptoms at bay:  Keep the pet out of your bedroom and restrict it to only a few rooms. Be advised that keeping the dog or cat in only one room will not limit the allergens to that room. Don't pet, hug or kiss the dog or cat; if you do, wash your hands with soap and water. High-efficiency particulate air (HEPA) cleaners run continuously in a bedroom or living room can reduce allergen levels over time. Regular use of a  high-efficiency vacuum cleaner or a central vacuum can reduce allergen levels. Giving your dog or cat a bath at least once a week can reduce airborne allergen.  Control of Cockroach Allergen  Cockroach allergen has been identified as an important cause of acute attacks of asthma, especially in urban settings.  There are fifty-five species of cockroach that exist in the [place], however only three, the [place], [place] species produce allergen that can affect patients with Asthma.  Allergens can be obtained from fecal particles, egg casings and secretions from cockroaches.    Remove food sources. Reduce access to water. Seal access and entry points. Spray runways with 0.5-1% Diazinon or Chlorpyrifos Blow boric acid power under stoves and refrigerator. Place bait stations (hydramethylnon) at feeding sites.    Return in about 4 months (around 10/09/2022), or if symptoms worsen or fail to improve.    Thank you for the opportunity to care for this patient.  Please do not hesitate to contact me with questions.  Nehemiah Settle, FNP [facility]

## 2022-06-11 LAB — ALLERGEN PROFILE, FOOD-FISH
Allergen Mackerel IgE: <0.1 kU/L
Allergen Salmon IgE: 0.29 kU/L — AB
Allergen Trout IgE: 0.18 kU/L — AB
Allergen Walley Pike IgE: 0.2 kU/L — AB
Codfish IgE: 1.18 kU/L — AB
Halibut IgE: <0.1 kU/L
Tuna: 11.9 kU/L — AB

## 2022-06-11 LAB — IGE NUT PROF. W/COMPONENT RFLX
F017-IgE Hazelnut (Filbert): 0.21 kU/L — AB
F018-IgE Brazil Nut: <0.1 kU/L
F020-IgE Almond: <0.1 kU/L
F202-IgE Cashew Nut: <0.1 kU/L
F203-IgE Pistachio Nut: 0.56 kU/L — AB
F256-IgE Walnut: 0.13 kU/L — AB
Macadamia Nut, IgE: 0.19 kU/L — AB
Peanut, IgE: 0.32 kU/L — AB
Pecan Nut IgE: <0.1 kU/L

## 2022-06-11 LAB — ALLERGEN PROFILE, SHELLFISH
Clam IgE: 44 kU/L — AB
F023-IgE Crab: >100 kU/L — AB
F080-IgE Lobster: >100 kU/L — AB
F290-IgE Oyster: 28 kU/L — AB
Scallop IgE: 79.2 kU/L — AB
Shrimp IgE: >100 kU/L — AB

## 2022-06-11 LAB — PANEL 604726
Cor A 1 IgE: <0.1 kU/L
Cor A 14 IgE: <0.1 kU/L
Cor A 8 IgE: <0.1 kU/L
Cor A 9 IgE: <0.1 kU/L

## 2022-06-11 LAB — ALLERGEN COMPONENT COMMENTS

## 2022-06-11 LAB — ALLERGEN PROFILE, FOOD-LEGUME
Allergen Lentil IgE: 0.18 kU/L — AB
Soybean IgE: 0.19 kU/L — AB

## 2022-06-11 LAB — PANEL 604721
Jug R 1 IgE: <0.1 kU/L
Jug R 3 IgE: <0.1 kU/L

## 2022-06-11 LAB — PEANUT COMPONENTS
F352-IgE Ara h 8: <0.1 kU/L
F422-IgE Ara h 1: <0.1 kU/L
F423-IgE Ara h 2: <0.1 kU/L
F424-IgE Ara h 3: <0.1 kU/L
F427-IgE Ara h 9: <0.1 kU/L
F447-IgE Ara h 6: <0.1 kU/L

## 2022-06-11 LAB — ALLERGEN, STRAWBERRY, F44: Allergen Strawberry IgE: 0.92 kU/L — AB

## 2022-06-11 LAB — F262-IGE EGGPLANT: F262-IgE Eggplant: <0.1 kU/L

## 2022-06-12 NOTE — Progress Notes (Signed)
Will need an interpreter.  Please let Ernest Griffin know that we received his lab results.  Shellfish: these labs were very elevated. He needs to continue to avoid all shellfish and have access to his epinephrine auto injector device.  Fish: These labs were elevated. He needs to continue to avoid fish. From my note it looks like he can eat [place] without any problems. If this is correct he can continue to eat [place], if not he needs to avoid all fish.  Strawberry: elevated. Continue to avoid for now.  Eggplant: lab work is negative. If he is interested we could schedule an in office oral food challenge to eggplant if interested. If he is not interested in an in office oral food challenge he needs to continue to avoid eggplant.  Peanut: lab work was low and his peanut components were negative. If he is interested we could schedule an in office oral food challenge to peanut butter. If he is not interested he can continue to avoid peanuts.  Tree nuts: Continue to avoid all tree nuts for now.  Legumes: lab results were low enough to offer an in office oral food challenge if interested. If not interested he needs to continue to avoid.  If he is interested in doing separate  in office oral food challenges he will need to be off all antihistamines 3 days prior to his appointment. These appointments will last approximately 2-3 hours each and he will need to be in good health the days of the challenges. (Not on any antibiotics)

## 2022-07-09 ENCOUNTER — Other Ambulatory Visit: Payer: Self-pay | Admitting: Critical Care Medicine

## 2022-07-10 ENCOUNTER — Encounter: Payer: Self-pay | Admitting: Critical Care Medicine

## 2022-07-10 ENCOUNTER — Ambulatory Visit: Payer: BC Managed Care – PPO | Attending: Critical Care Medicine | Admitting: Critical Care Medicine

## 2022-07-10 VITALS — BP 114/80 | HR 72 | Ht 69.5 in | Wt 173.6 lb

## 2022-07-10 DIAGNOSIS — Z1211 Encounter for screening for malignant neoplasm of colon: Secondary | ICD-10-CM | POA: Diagnosis not present

## 2022-07-10 DIAGNOSIS — J4540 Moderate persistent asthma, uncomplicated: Secondary | ICD-10-CM | POA: Diagnosis not present

## 2022-07-10 DIAGNOSIS — J3089 Other allergic rhinitis: Secondary | ICD-10-CM

## 2022-07-10 DIAGNOSIS — K219 Gastro-esophageal reflux disease without esophagitis: Secondary | ICD-10-CM | POA: Diagnosis not present

## 2022-07-10 DIAGNOSIS — M26649 Arthritis of unspecified temporomandibular joint: Secondary | ICD-10-CM | POA: Insufficient documentation

## 2022-07-10 DIAGNOSIS — M26642 Arthritis of left temporomandibular joint: Secondary | ICD-10-CM

## 2022-07-10 DIAGNOSIS — R1012 Left upper quadrant pain: Secondary | ICD-10-CM

## 2022-07-10 MED ORDER — AZELASTINE HCL 0.1 % NA SOLN: 1.0000 | Freq: Two times a day (BID) | NASAL | 5 refills | Status: DC | PRN

## 2022-07-10 MED ORDER — BUDESONIDE-FORMOTEROL FUMARATE 160-4.5 MCG/ACT IN AERO
2.0000 | INHALATION_SPRAY | Freq: Two times a day (BID) | RESPIRATORY_TRACT | 5 refills | Status: DC
Start: 2022-07-10 — End: 2023-05-23

## 2022-07-10 MED ORDER — PANTOPRAZOLE SODIUM 40 MG PO TBEC: 40.0000 mg | DELAYED_RELEASE_TABLET | Freq: Two times a day (BID) | ORAL | 2 refills | Status: DC

## 2022-07-10 MED ORDER — ALBUTEROL SULFATE HFA 108 (90 BASE) MCG/ACT IN AERS: 2.0000 | INHALATION_SPRAY | Freq: Four times a day (QID) | RESPIRATORY_TRACT | 1 refills | Status: DC | PRN

## 2022-07-10 NOTE — Assessment & Plan Note (Signed)
Persisting abdominal pain due to reflux we will refer again to gastroenterology patient states he will except the appointment this time  Patient given a reflux diet  Patient told to renew Protonix

## 2022-07-10 NOTE — Assessment & Plan Note (Signed)
Stable moderate persistent asthma continue Symbicort as prescribed

## 2022-07-10 NOTE — Assessment & Plan Note (Signed)
Patient is yet to process colon cancer screening kit was reissued

## 2022-07-10 NOTE — Patient Instructions (Signed)
Referral to gastroenterology was made Dr. Nils Pyle on medications sent to your pharmacies  Follow reflux diet as attached  On your peak flow meter check this daily your Green zone  is 450 and greater, your yellow zone is 350-250  You need to call Dr. Joya Gaskins if your peak flow rate is less than 250 this indicate your asthma is severe  Return to Dr. Joya Gaskins 5 months  Pick up colon cancer screening kit at the lab

## 2022-07-10 NOTE — Assessment & Plan Note (Signed)
As per reflux assessment °

## 2022-07-10 NOTE — Progress Notes (Signed)
Patient states that the area above his left ear starts to twitch from time to time. Patient stated that he noticed it happening for the last month.

## 2022-07-10 NOTE — Progress Notes (Addendum)
Established Patient Office Visit  Subjective:  Patient ID: Ernest Griffin, male    DOB: 11/27/1976  Age: 46 y.o. MRN: [id number]  CC:   Asthma follow-up  HPI  12/2021  this visit was accomplished by Arabic interpreter Claudine #[id number] on video Mohammedali A Fry presents for primary care follow-up.  Patient was last seen in November when he had a left lower extremity injury.  He subsequently got in with his orthopedist who did an MRI of the left lower extremity which showed a stress fracture of the tibia.  This healed on its own secondarily and the pain in the leg is now gone.  We filled out his FMLA paperwork in November it was my impression it was just for this leg injury however the patient wished to also include frequent absences that may occur as often as 1 time a week for his asthma.  He has significant allergic factors and has since been seen twice by allergy and asthma.  He was seen end of October and then again just this month.  Extensive testing has shown he has a severe shellfish allergy and also has allergies to environmental grasses trees and cats.  The patient states he will occasionally miss work on average over a 34-monthinterval 1 time a week because of this.  He works in a cFish farm managerand a fFreight forwarder  Patient also complains of left upper quadrant pain is been going on for about 6 weeks is progressively worse he has been to the emergency room twice for this.  No imaging is yet to occur.  He had a virtual visit with our PA last week and they ordered gastroenterology referral and Protonix however he never received the prescription for the Protonix.  When he went to the emergency room they did give him Carafate and he does have this medication.  He also notes the pain is gotten much worse in the left upper quadrant though he denies any nausea or vomiting.  He does not smoke or drink alcohol.  On arrival blood pressure is good 118/77.  [place]  [place] any [place]  He has never been tested for H. pylori.  He does have a pending appoint with gastroenterology but has not yet to be scheduled.   8/7 Patient seen return follow-up last visit was in January.  His left upper quadrant abdominal pain continues.  He is yet to achieve a CT scan.  He declined a GI referral in the call.  This visit was assisted by Arabic interpreter clonidine [id number]  Patient also is due a colon cancer screening.  He states that orange juice and chocolate make his symptoms worse with his reflux.  He states the pantoprazole is beneficial.  Patient's asthma symptoms are stable at this time.  [facility] Patient complains of pain above the left ear with twitching  Past Medical History:  Diagnosis Date   Asthma     History reviewed. No pertinent surgical history.  Family History  Problem Relation Age of Onset   Healthy Father    Allergic rhinitis Daughter     Social History   Socioeconomic History   Marital status: Married    Spouse name: Not on file   Number of children: Not on file   Years of education: Not on file   Highest education  level: Not on file  Occupational History   Occupation: [employer]   Tobacco Use   Smoking status: Never   Smokeless tobacco: Never  Vaping Use   Vaping Use: Never used  Substance and Sexual Activity   Alcohol use: No    Alcohol/week: 0.0 standard drinks of alcohol   Drug use: No   Sexual activity: Yes  Other Topics Concern   Not on file  Social History Narrative   Works at [employer]    From [place]    Social Determinants of Health   Financial Resource Strain: Not on file  Food Insecurity: Not on file  Transportation Needs: Not on file  Physical Activity: Not on file  Stress: Not on file  Social Connections: Not on file  Intimate Partner Violence: Not on file    Outpatient Medications  Prior to Visit  Medication Sig Dispense Refill   cetirizine (ZYRTEC ALLERGY) 10 MG tablet Take 1 tablet (10 mg total) by mouth daily. 30 tablet 5   EPINEPHrine 0.3 mg/0.3 mL IJ SOAJ injection Inject 0.3 mg into the muscle as needed for anaphylaxis. 1 each 2   olopatadine (PATADAY) 0.1 % ophthalmic solution Place 1 drop into both eyes 2 (two) times daily. 5 mL 4   Spacer/Aero-Holding Chambers (AEROCHAMBER PLUS) inhaler Use as instructed 1 each 2   albuterol (VENTOLIN HFA) 108 (90 Base) MCG/ACT inhaler Inhale 2 puffs into the lungs every 6 (six) hours as needed for wheezing or shortness of breath. Needs office visit 18 g 1   azelastine (ASTELIN) 0.1 % nasal spray Place 1-2 sprays into both nostrils 2 (two) times daily as needed (nasal drainage). Use in each nostril as directed 30 mL 5   budesonide-formoterol (SYMBICORT) 160-4.5 MCG/ACT inhaler Inhale 2 puffs into the lungs 2 (two) times daily. 10.2 g 5   pantoprazole (PROTONIX) 40 MG tablet Take 1 tablet (40 mg total) by mouth 2 (two) times daily before a meal. 60 tablet 2   sucralfate (CARAFATE) 1 g tablet Take 1 tablet (1 g total) by mouth 4 (four) times daily -  with meals and at bedtime. 60 tablet 2   fluticasone (FLONASE) 50 MCG/ACT nasal spray Place 2 sprays into both nostrils 2 (two) times daily. Must have office visit for refills 16 g 1   No facility-administered medications prior to visit.    Allergies  Allergen Reactions   Caffeine    Ibuprofen     Pruritis    Strawberry (Diagnostic) Itching    ROS Review of Systems  Constitutional:  Negative for chills, diaphoresis and fever.  HENT:  Negative for congestion, hearing loss, nosebleeds, postnasal drip, rhinorrhea, sore throat, tinnitus, trouble swallowing and voice change.   Eyes:  Negative for photophobia, redness and itching.  Respiratory:  Negative for cough, shortness of breath, wheezing and stridor.   Cardiovascular:  Negative for chest pain, palpitations and leg swelling.   Gastrointestinal:  Positive for abdominal pain. Negative for abdominal distention, anal bleeding, blood in stool, constipation, diarrhea, nausea, rectal pain and vomiting.       Reflux symptoms  Endocrine: Negative for polydipsia.  Genitourinary:  Negative for dysuria, flank pain, frequency, hematuria and urgency.  Musculoskeletal:  Negative for back pain, myalgias and neck pain.  Skin:  Negative for rash.  Allergic/Immunologic: Negative for environmental allergies.  Neurological:  Negative for dizziness, tremors, seizures, weakness and headaches.  Hematological:  Does not bruise/bleed easily.  Psychiatric/Behavioral: Negative.  Negative for suicidal ideas. The patient is not  nervous/anxious.       Objective:    Physical Exam Vitals reviewed.  Constitutional:      Appearance: Normal appearance. He is well-developed. He is not diaphoretic.  HENT:     Head: Normocephalic and atraumatic.     Nose: No nasal deformity, septal deviation, mucosal edema or rhinorrhea.     Right Sinus: No maxillary sinus tenderness or frontal sinus tenderness.     Left Sinus: No maxillary sinus tenderness or frontal sinus tenderness.     Mouth/Throat:     Pharynx: No oropharyngeal exudate.     Comments: Poor dentition Eyes:     General: No scleral icterus.    Conjunctiva/sclera: Conjunctivae normal.     Pupils: Pupils are equal, round, and reactive to light.  Neck:     Thyroid: No thyromegaly.     Vascular: No carotid bruit or JVD.     Trachea: Trachea normal. No tracheal tenderness or tracheal deviation.  Cardiovascular:     Rate and Rhythm: Normal rate and regular rhythm.     Chest Wall: PMI is not displaced.     Pulses: Normal pulses. No decreased pulses.     Heart sounds: Normal heart sounds, S1 normal and S2 normal. Heart sounds not distant. No murmur heard.    No systolic murmur is present.     No diastolic murmur is present.     No friction rub. No gallop. No S3 or S4 sounds.  Pulmonary:      Effort: Pulmonary effort is normal. No tachypnea, accessory muscle usage or respiratory distress.     Breath sounds: Normal breath sounds. No stridor. No decreased breath sounds, wheezing, rhonchi or rales.  Chest:     Chest wall: No tenderness.  Abdominal:     General: Abdomen is flat. Bowel sounds are normal. There is no distension.     Palpations: Abdomen is soft. Abdomen is not rigid. There is no mass.     Tenderness: There is abdominal tenderness. There is no right CVA tenderness, left CVA tenderness, guarding or rebound.     Hernia: No hernia is present.     Comments: Abdomen is not distended there is left upper quadrant tenderness with guarding but no rebound no masses seen bowel sounds are normal  Musculoskeletal:        General: Normal range of motion.     Cervical back: Normal range of motion and neck supple. No edema, erythema or rigidity. No muscular tenderness. Normal range of motion.  Lymphadenopathy:     Head:     Right side of head: No submental or submandibular adenopathy.     Left side of head: No submental or submandibular adenopathy.     Cervical: No cervical adenopathy.  Skin:    General: Skin is warm and dry.     Coloration: Skin is not pale.     Findings: No rash.     Nails: There is no clubbing.  Neurological:     Mental Status: He is alert and oriented to person, place, and time.     Sensory: No sensory deficit.  Psychiatric:        Speech: Speech normal.        Behavior: Behavior normal.     BP 114/80   Pulse 72   Ht 5' 9.5" (1.765 m)   Wt 173 lb 9.6 oz (78.7 kg)   SpO2 97%   BMI 25.27 kg/m  Wt Readings from Last 3 Encounters:  07/10/22 173 lb  9.6 oz (78.7 kg)  06/08/22 179 lb 9.6 oz (81.5 kg)  01/03/22 184 lb 12.8 oz (83.8 kg)   Patient instructed as to proper use of peak flow meter peak flow rate today is 550 which is normal  Health Maintenance Due  Topic Date Due   COLON CANCER SCREENING ANNUAL FOBT  Never done   INFLUENZA VACCINE   07/04/2022    There are no preventive care reminders to display for this patient.  No results found for: "TSH" Lab Results  Component Value Date   WBC 5.2 12/21/2021   HGB 13.4 12/21/2021   HCT 38.8 (L) 12/21/2021   MCV 86.6 12/21/2021   PLT 239 12/21/2021   Lab Results  Component Value Date   NA 136 12/21/2021   K 4.1 12/21/2021   CO2 28 12/21/2021   GLUCOSE 88 12/21/2021   BUN 10 12/21/2021   CREATININE 1.08 12/21/2021   BILITOT 0.5 12/21/2021   ALKPHOS 79 12/21/2021   AST 25 12/21/2021   ALT 23 12/21/2021   PROT 7.3 12/21/2021   ALBUMIN 4.1 12/21/2021   CALCIUM 9.2 12/21/2021   ANIONGAP 4 (L) 12/21/2021   No results found for: "CHOL" No results found for: "HDL" No results found for: "LDLCALC" No results found for: "TRIG" No results found for: "CHOLHDL" Lab Results  Component Value Date   HGBA1C 5.60 01/17/2016      Assessment & Plan:   Problem List Items Addressed This Visit       Respiratory   Rhinitis, allergic (Chronic)    Continue with as needed antihistamines and Astelin orally and nasally      Asthma, moderate persistent    Stable moderate persistent asthma continue Symbicort as prescribed      Relevant Medications   albuterol (VENTOLIN HFA) 108 (90 Base) MCG/ACT inhaler   budesonide-formoterol (SYMBICORT) 160-4.5 MCG/ACT inhaler     Digestive   Gastroesophageal reflux disease - Primary    Persisting abdominal pain due to reflux we will refer again to gastroenterology patient states he will except the appointment this time  Patient given a reflux diet  Patient told to renew Protonix      Relevant Medications   pantoprazole (PROTONIX) 40 MG tablet   Other Relevant Orders   Ambulatory referral to Gastroenterology     Musculoskeletal and Integument   TMJ arthritis    Symptoms of twitching in the left skull compatible with TMJ this exacerbated by the periodontal and cavitary dental disease he is encouraged to see dentist         Other   LUQ abdominal pain    As per reflux assessment      Colon cancer screening    Patient is yet to process colon cancer screening kit was reissued      Relevant Orders   Fecal occult blood, imunochemical   Meds ordered this encounter  Medications   pantoprazole (PROTONIX) 40 MG tablet    Sig: Take 1 tablet (40 mg total) by mouth 2 (two) times daily before a meal.    Dispense:  120 tablet    Refill:  2   albuterol (VENTOLIN HFA) 108 (90 Base) MCG/ACT inhaler    Sig: Inhale 2 puffs into the lungs every 6 (six) hours as needed for wheezing or shortness of breath. Needs office visit    Dispense:  18 g    Refill:  1   azelastine (ASTELIN) 0.1 % nasal spray    Sig: Place 1-2 sprays into both nostrils  2 (two) times daily as needed (nasal drainage). Use in each nostril as directed    Dispense:  30 mL    Refill:  5   budesonide-formoterol (SYMBICORT) 160-4.5 MCG/ACT inhaler    Sig: Inhale 2 puffs into the lungs 2 (two) times daily.    Dispense:  10.2 g    Refill:  5   Follow-up: Return in about 5 months (around 12/10/2022) for asthma, gastritis.    Asencion Noble, MD

## 2022-07-10 NOTE — Assessment & Plan Note (Signed)
Symptoms of twitching in the left skull compatible with TMJ this exacerbated by the periodontal and cavitary dental disease he is encouraged to see dentist

## 2022-07-10 NOTE — Assessment & Plan Note (Signed)
Continue with as needed antihistamines and Astelin orally and nasally

## 2022-08-14 ENCOUNTER — Telehealth: Payer: Self-pay | Admitting: Emergency Medicine

## 2022-08-14 ENCOUNTER — Telehealth: Payer: Self-pay | Admitting: *Deleted

## 2022-08-14 NOTE — Telephone Encounter (Signed)
Copied from CRM [id number]. Topic: General - Inquiry >> Aug 14, 2022  3:43 PM Marlow Baars wrote: Reason for CRM: Patient called in stating paperwork concerning FMLA will be refaxed over from [facility] and he wants the office to be on the lookout for it. It will be a medical certification that needs to be filled out. Please assist patient further

## 2022-08-14 NOTE — Telephone Encounter (Signed)
Received call from patient that he needed Symbicort refilled and [facility] to [facility].  Researched to see if our office had sent in refill or was working on prior authorization for patient.  Symbicort was filled by Dr. Delford Field and patient has refills.  Called and left message for patient with details to notify pharmacy for his Symbicort refill.  Will reach out to the patient tomorrow for follow up.

## 2022-08-15 NOTE — Telephone Encounter (Signed)
Noted I will put paper in provider box once received

## 2022-08-21 ENCOUNTER — Other Ambulatory Visit: Payer: Self-pay | Admitting: Physician Assistant

## 2022-08-21 DIAGNOSIS — R109 Unspecified abdominal pain: Secondary | ICD-10-CM

## 2022-08-21 DIAGNOSIS — R1012 Left upper quadrant pain: Secondary | ICD-10-CM

## 2022-08-23 ENCOUNTER — Inpatient Hospital Stay: Admission: RE | Admit: 2022-08-23 | Payer: BC Managed Care – PPO | Source: Ambulatory Visit

## 2022-08-25 ENCOUNTER — Other Ambulatory Visit: Payer: BC Managed Care – PPO

## 2022-08-28 ENCOUNTER — Ambulatory Visit
Admission: RE | Admit: 2022-08-28 | Discharge: 2022-08-28 | Disposition: A | Discharge status: Home / Self Care | Payer: BC Managed Care – PPO | Source: Ambulatory Visit | Attending: Physician Assistant | Admitting: Physician Assistant

## 2022-08-28 DIAGNOSIS — R1012 Left upper quadrant pain: Secondary | ICD-10-CM

## 2022-08-28 DIAGNOSIS — R109 Unspecified abdominal pain: Secondary | ICD-10-CM

## 2022-08-29 ENCOUNTER — Other Ambulatory Visit: Payer: Self-pay | Admitting: Physician Assistant

## 2022-08-29 DIAGNOSIS — N2889 Other specified disorders of kidney and ureter: Secondary | ICD-10-CM

## 2022-08-29 DIAGNOSIS — R160 Hepatomegaly, not elsewhere classified: Secondary | ICD-10-CM

## 2022-09-04 ENCOUNTER — Ambulatory Visit: Payer: BC Managed Care – PPO | Admitting: Allergy

## 2022-09-18 ENCOUNTER — Other Ambulatory Visit: Payer: Self-pay | Admitting: Physician Assistant

## 2022-09-23 ENCOUNTER — Ambulatory Visit
Admission: RE | Admit: 2022-09-23 | Discharge: 2022-09-23 | Disposition: A | Discharge status: Home / Self Care | Payer: BC Managed Care – PPO | Source: Ambulatory Visit | Attending: Physician Assistant | Admitting: Physician Assistant

## 2022-09-23 DIAGNOSIS — N2889 Other specified disorders of kidney and ureter: Secondary | ICD-10-CM

## 2022-09-23 DIAGNOSIS — R160 Hepatomegaly, not elsewhere classified: Secondary | ICD-10-CM

## 2022-09-23 MED ORDER — GADOPICLENOL 0.5 MMOL/ML IV SOLN
8.0000 mL | Freq: Once | INTRAVENOUS | Status: AC | PRN
  Administered 2022-09-23: 8 mL via INTRAVENOUS

## 2022-11-01 ENCOUNTER — Ambulatory Visit: Payer: BC Managed Care – PPO | Admitting: Allergy

## 2022-11-21 NOTE — Progress Notes (Unsigned)
Established Patient Office Visit  Subjective:  Patient ID: Ernest Griffin, male    DOB: 07-May-1976  Age: 46 y.o. MRN: [id number] Virtual Visit via Telephone Note  I connected with Ernest Griffin on 11/22/22 at  9:30 AM EST by telephone and verified that I am speaking with the correct person using two identifiers.   Consent:  I discussed the limitations, risks, security and privacy concerns of performing an evaluation and management service by telephone and the availability of in person appointments. I also discussed with the patient that there may be a patient responsible charge related to this service. The patient expressed understanding and agreed to proceed.  Location of patient: Patient's at home  Location of provider: I am in my office  Persons participating in the televisit with the patient.   No one else on the call    CC:   FMLA paperwork  HPI  12/2021  this visit was accomplished by Arabic interpreter Claudine #[id number] on video Ernest Griffin presents for primary care follow-up.  Patient was last seen in November when he had a left lower extremity injury.  He subsequently got in with his orthopedist who did an MRI of the left lower extremity which showed a stress fracture of the tibia.  This healed on its own secondarily and the pain in the leg is now gone.  We filled out his FMLA paperwork in November it was my impression it was just for this leg injury however the patient wished to also include frequent absences that may occur as often as 1 time a week for his asthma.  He has significant allergic factors and has since been seen twice by allergy and asthma.  He was seen end of October and then again just this month.  Extensive testing has shown he has a severe shellfish allergy and also has allergies to environmental grasses trees and cats.  The patient states he will occasionally miss work on average over a 93-month interval 1 time a week because of this.  He works in a Acupuncturist  and a Estate agent.  Patient also complains of left upper quadrant pain is been going on for about 6 weeks is progressively worse he has been to the emergency room twice for this.  No imaging is yet to occur.  He had a virtual visit with our PA last week and they ordered gastroenterology referral and Protonix however he never received the prescription for the Protonix.  When he went to the emergency room they did give him Carafate and he does have this medication.  He also notes the pain is gotten much worse in the left upper quadrant though he denies any nausea or vomiting.  He does not smoke or drink alcohol.  On arrival blood pressure is good 118/77.  He has been in the [place] for 7 years immigrating from [place].  He denies any abdominal pain while in [place].  He has never been tested for H. pylori.  He does have a pending appoint with gastroenterology but has not yet to be scheduled.   8/7 Patient seen return follow-up last visit was in January.  His left upper quadrant abdominal pain continues.  He is yet to achieve a CT scan.  He declined a GI referral in the call.  This visit was assisted by Arabic interpreter clonidine [id number]  Patient also is due a colon cancer screening.  He states that orange juice and chocolate make his symptoms worse with  his reflux.  He states the pantoprazole is beneficial.  Patient's asthma symptoms are stable at this time.  [facility]. Patient complains of pain above the left ear with twitching  12/20 Patient here for FMLA paperwork history of asthma he flares up about 3-4 times every month and requires to being out of work for at least a day with each flareup he is under some control with an allergist and follows with him.  He would like a flu vaccine will come in later for this.  He needs a work note for being out of work today to make this call.    Past Medical History:  Diagnosis Date   Asthma      History reviewed. No pertinent surgical history.  Family History  Problem Relation Age of Onset   Healthy Father    Allergic rhinitis Daughter     Social History   Socioeconomic History   Marital status: Married    Spouse name: Not on file   Number of children: Not on file   Years of education: Not on file   Highest education level: Not on file  Occupational History   Occupation: [employer]   Tobacco Use   Smoking status: Never   Smokeless tobacco: Never  Vaping Use   Vaping Use: Never used  Substance and Sexual Activity   Alcohol use: No    Alcohol/week: 0.0 standard drinks of alcohol   Drug use: No   Sexual activity: Yes  Other Topics Concern   Not on file  Social History Narrative   Works at [employer]    From [place]    Social Determinants of Corporate investment bankerHealth   Financial Resource Strain: Not on file  Food Insecurity: Not on file  Transportation Needs: Not on file  Physical Activity: Not on file  Stress: Not on file  Social Connections: Not on file  Intimate Partner Violence: Not on file    Outpatient Medications Prior to Visit  Medication Sig Dispense Refill   albuterol (VENTOLIN HFA) 108 (90 Base) MCG/ACT inhaler Inhale 2 puffs into the lungs every 6 (six) hours as needed for wheezing or shortness of breath. Needs office visit 18 g 1   azelastine (ASTELIN) 0.1 % nasal spray Place 1-2 sprays into both nostrils 2 (two) times daily as needed (nasal drainage). Use in each nostril as directed 30 mL 5   budesonide-formoterol (SYMBICORT) 160-4.5 MCG/ACT inhaler Inhale 2 puffs into the lungs 2 (two) times daily. 10.2 g 5   cetirizine (ZYRTEC ALLERGY) 10 MG tablet Take 1 tablet (10 mg total) by mouth daily. 30 tablet 5   EPINEPHrine 0.3 mg/0.3 mL IJ SOAJ injection Inject 0.3 mg into the muscle as needed for anaphylaxis. 1 each 2   olopatadine (PATADAY) 0.1 % ophthalmic solution Place 1 drop into both eyes 2 (two) times daily. 5 mL 4   pantoprazole (PROTONIX) 40 MG  tablet Take 1 tablet (40 mg total) by mouth 2 (two) times daily before a meal. 120 tablet 2   Spacer/Aero-Holding Chambers (AEROCHAMBER PLUS) inhaler Use as instructed 1 each 2   albuterol (ACCUNEB) 0.63 MG/3ML nebulizer solution Inhale 1 ampule into the lungs every 6 (six) hours as needed.     fluticasone (FLONASE) 50 MCG/ACT nasal spray Place 2 sprays into both nostrils 2 (two) times daily. Must have office visit for refills 16 g 1   No facility-administered medications prior to visit.  Allergies  Allergen Reactions   Caffeine    Ibuprofen     Pruritis    Strawberry (Diagnostic) Itching    ROS Review of Systems  Constitutional:  Negative for chills, diaphoresis and fever.  HENT:  Negative for congestion, hearing loss, nosebleeds, postnasal drip, rhinorrhea, sore throat, tinnitus, trouble swallowing and voice change.   Eyes:  Negative for photophobia, redness and itching.  Respiratory:  Negative for cough, shortness of breath, wheezing and stridor.   Cardiovascular:  Negative for chest pain, palpitations and leg swelling.  Gastrointestinal:  Positive for abdominal pain. Negative for abdominal distention, anal bleeding, blood in stool, constipation, diarrhea, nausea, rectal pain and vomiting.       Reflux symptoms  Endocrine: Negative for polydipsia.  Genitourinary:  Negative for dysuria, flank pain, frequency, hematuria and urgency.  Musculoskeletal:  Negative for back pain, myalgias and neck pain.  Skin:  Negative for rash.  Allergic/Immunologic: Negative for environmental allergies.  Neurological:  Negative for dizziness, tremors, seizures, weakness and headaches.  Hematological:  Does not bruise/bleed easily.  Psychiatric/Behavioral: Negative.  Negative for suicidal ideas. The patient is not nervous/anxious.       Objective:    No exam this is a phone visit  There were no vitals taken for this visit. Wt Readings from Last 3 Encounters:  07/10/22 173 lb 9.6 oz (78.7  kg)  06/08/22 179 lb 9.6 oz (81.5 kg)  01/03/22 184 lb 12.8 oz (83.8 kg)     Health Maintenance Due  Topic Date Due   COLON CANCER SCREENING ANNUAL FOBT  Never done    There are no preventive care reminders to display for this patient.  No results found for: "TSH" Lab Results  Component Value Date   WBC 5.2 12/21/2021   HGB 13.4 12/21/2021   HCT 38.8 (L) 12/21/2021   MCV 86.6 12/21/2021   PLT 239 12/21/2021   Lab Results  Component Value Date   NA 136 12/21/2021   K 4.1 12/21/2021   CO2 28 12/21/2021   GLUCOSE 88 12/21/2021   BUN 10 12/21/2021   CREATININE 1.08 12/21/2021   BILITOT 0.5 12/21/2021   ALKPHOS 79 12/21/2021   AST 25 12/21/2021   ALT 23 12/21/2021   PROT 7.3 12/21/2021   ALBUMIN 4.1 12/21/2021   CALCIUM 9.2 12/21/2021   ANIONGAP 4 (L) 12/21/2021   No results found for: "CHOL" No results found for: "HDL" No results found for: "LDLCALC" No results found for: "TRIG" No results found for: "CHOLHDL" Lab Results  Component Value Date   HGBA1C 5.60 01/17/2016      Assessment & Plan:   Problem List Items Addressed This Visit       Respiratory   Asthma, moderate persistent    Refilled asthma medications and filled out FMLA paperwork      Relevant Medications   albuterol (ACCUNEB) 0.63 MG/3ML nebulizer solution   Other Visit Diagnoses     Administrative encounter    -  Primary     No orders of the defined types were placed in this encounter.  Follow-up: No follow-ups on file.  Follow Up Instructions:    I discussed the assessment and treatment plan with the patient. The patient was provided an opportunity to ask questions and all were answered. The patient agreed with the plan and demonstrated an understanding of the instructions.   The patient was advised to call back or seek an in-person evaluation if the symptoms worsen or if the condition fails to  improve as anticipated.  I provided 15 minutes of non-face-to-face time during this  encounter  including  median intraservice time , review of notes, labs, imaging, medications  and explaining diagnosis and management to the patient .    Shan Levans, MD   Shan Levans, MD

## 2022-11-22 ENCOUNTER — Ambulatory Visit: Payer: BC Managed Care – PPO | Attending: Critical Care Medicine | Admitting: Critical Care Medicine

## 2022-11-22 ENCOUNTER — Encounter: Payer: Self-pay | Admitting: Critical Care Medicine

## 2022-11-22 ENCOUNTER — Ambulatory Visit (HOSPITAL_BASED_OUTPATIENT_CLINIC_OR_DEPARTMENT_OTHER): Payer: BC Managed Care – PPO

## 2022-11-22 DIAGNOSIS — J4540 Moderate persistent asthma, uncomplicated: Secondary | ICD-10-CM

## 2022-11-22 DIAGNOSIS — Z029 Encounter for administrative examinations, unspecified: Secondary | ICD-10-CM

## 2022-11-22 DIAGNOSIS — Z23 Encounter for immunization: Secondary | ICD-10-CM

## 2022-11-22 NOTE — Progress Notes (Signed)
Flu vaccine administered per protocols.  Information sheet given. Patient denies and pain or discomfort at injection site. Tolerated injection well no reaction.  

## 2022-11-22 NOTE — Assessment & Plan Note (Signed)
Refilled asthma medications and filled out FMLA paperwork

## 2022-12-18 ENCOUNTER — Ambulatory Visit: Payer: Medicaid Other | Admitting: Allergy

## 2023-01-08 ENCOUNTER — Ambulatory Visit: Payer: Medicaid Other | Admitting: Allergy

## 2023-05-17 ENCOUNTER — Telehealth: Payer: Self-pay | Admitting: Critical Care Medicine

## 2023-05-17 NOTE — Telephone Encounter (Signed)
The patient called back checking on status of the faxed FMLA paperwork. He states if he can get a return call sometime after 2:30 until 5 anyday as he is busy before then. Please assist patient further

## 2023-05-17 NOTE — Telephone Encounter (Signed)
Copied from CRM [id number]. Topic: General - Other >> May 16, 2023  3:51 PM Dondra Prader E wrote: Reason for CRM: Pt called regarding a fax that was submitted yesterday regarding FMLA from [facility]. Please advise if so   Best contact:  [phone] may leave a VM

## 2023-05-18 NOTE — Telephone Encounter (Signed)
Called patient and left voicemail   I have not seen any paperwork regarding this   Interpreter [phone]

## 2023-05-23 ENCOUNTER — Other Ambulatory Visit: Payer: Self-pay

## 2023-05-23 MED ORDER — BUDESONIDE-FORMOTEROL FUMARATE 160-4.5 MCG/ACT IN AERO: 2.0000 | INHALATION_SPRAY | Freq: Two times a day (BID) | RESPIRATORY_TRACT | 1 refills | Status: DC

## 2023-05-23 MED ORDER — CETIRIZINE HCL 10 MG PO TABS: 10.0000 mg | ORAL_TABLET | Freq: Every day | ORAL | 0 refills | Status: DC

## 2023-05-23 MED ORDER — FLUTICASONE PROPIONATE 50 MCG/ACT NA SUSP: 2.0000 | Freq: Two times a day (BID) | NASAL | 1 refills | Status: DC

## 2023-05-23 NOTE — Telephone Encounter (Signed)
This encounter was created in error - please disregard.

## 2023-05-23 NOTE — Telephone Encounter (Signed)
Requested Prescriptions  Pending Prescriptions Disp Refills   budesonide-formoterol (SYMBICORT) 160-4.5 MCG/ACT inhaler 10.2 g 5    Sig: Inhale 2 puffs into the lungs 2 (two) times daily.     Pulmonology:  Combination Products Passed - 05/23/2023  4:39 PM      Passed - Valid encounter within last 12 months    Recent Outpatient Visits           6 months ago Administrative encounter   [facility] Storm Frisk, [facility]   10 months ago Gastroesophageal reflux disease, unspecified whether esophagitis present   [facility] Storm Frisk, MD   1 year ago LUQ abdominal pain   [place] [facility] Storm Frisk, MD   1 year ago Gastroesophageal reflux disease, unspecified whether esophagitis present   [facility] [place], Marzella Schlein, [place]   1 year ago Injury of left lower extremity, subsequent encounter   [facility] Storm Frisk, MD       Future Appointments             Tomorrow Ambs, Norvel Richards, FNP [facility] [place] [facility]             cetirizine [facility] ALLERGY) 10 MG tablet 30 tablet 5    Sig: Take 1 tablet (10 mg total) by mouth daily.     Ear, Nose, and Throat:  Antihistamines 2 Failed - 05/23/2023  4:39 PM      Failed - Cr in normal range and within 360 days    Creat  Date Value Ref Range Status  01/17/2016 0.91 0.60 - 1.35 mg/dL Final   Creatinine, Ser  Date Value Ref Range Status  12/21/2021 1.08 0.61 - 1.24 mg/dL Final         Passed - Valid encounter within last 12 months    Recent Outpatient Visits           6 months ago Administrative encounter   [facility] Storm Frisk, MD   10 months ago Gastroesophageal reflux disease, unspecified whether esophagitis present   [facility] Storm Frisk, MD   1 year ago LUQ abdominal pain   [place] [facility] Storm Frisk, MD   1 year ago Gastroesophageal reflux disease, unspecified whether esophagitis present   [facility] [place], [place], [place]   1 year ago Injury of left lower extremity, subsequent encounter   [facility] Storm Frisk, MD       Future Appointments             Tomorrow Ambs, Norvel Richards, FNP [place] [facility] [place] [facility]             fluticasone [facility]) 50 MCG/ACT nasal spray 16 g 1    Sig: Place 2 sprays into both nostrils 2 (two) times daily. Must have office visit for refills     Ear, Nose, and Throat: Nasal Preparations - Corticosteroids Passed - 05/23/2023  4:39 PM      Passed - Valid encounter within last 12 months    Recent Outpatient Visits           6 months ago Administrative encounter  [facility] Storm Frisk, MD   10 months ago Gastroesophageal reflux disease, unspecified whether esophagitis present   [facility] Storm Frisk, MD   1 year ago LUQ abdominal pain   [place] [facility] Storm Frisk, MD   1 year ago Gastroesophageal reflux disease, unspecified whether esophagitis present   [facility] [place], Marzella Schlein, [place]   1 year ago Injury of left lower extremity, subsequent encounter   [facility] Storm Frisk, MD       Future Appointments             Tomorrow Ambs, Norvel Richards, FNP [place] [facility] [place] [facility]

## 2023-05-24 ENCOUNTER — Ambulatory Visit (INDEPENDENT_AMBULATORY_CARE_PROVIDER_SITE_OTHER): Payer: 59 | Admitting: Family Medicine

## 2023-05-24 ENCOUNTER — Encounter: Payer: Self-pay | Admitting: Family Medicine

## 2023-05-24 ENCOUNTER — Other Ambulatory Visit: Payer: Self-pay

## 2023-05-24 VITALS — BP 110/60 | HR 84 | Temp 98.6°F | Resp 16 | Wt 178.7 lb

## 2023-05-24 DIAGNOSIS — J45998 Other asthma: Secondary | ICD-10-CM | POA: Diagnosis not present

## 2023-05-24 DIAGNOSIS — J302 Other seasonal allergic rhinitis: Secondary | ICD-10-CM | POA: Insufficient documentation

## 2023-05-24 DIAGNOSIS — J3089 Other allergic rhinitis: Secondary | ICD-10-CM

## 2023-05-24 DIAGNOSIS — T7800XD Anaphylactic reaction due to unspecified food, subsequent encounter: Secondary | ICD-10-CM | POA: Diagnosis not present

## 2023-05-24 DIAGNOSIS — J45909 Unspecified asthma, uncomplicated: Secondary | ICD-10-CM

## 2023-05-24 DIAGNOSIS — K219 Gastro-esophageal reflux disease without esophagitis: Secondary | ICD-10-CM

## 2023-05-24 DIAGNOSIS — H1010 Acute atopic conjunctivitis, unspecified eye: Secondary | ICD-10-CM

## 2023-05-24 DIAGNOSIS — H1013 Acute atopic conjunctivitis, bilateral: Secondary | ICD-10-CM

## 2023-05-24 MED ORDER — FLUTICASONE PROPIONATE 50 MCG/ACT NA SUSP: 2.0000 | Freq: Every day | NASAL | 5 refills | Status: DC

## 2023-05-24 MED ORDER — ALBUTEROL SULFATE HFA 108 (90 BASE) MCG/ACT IN AERS: 2.0000 | INHALATION_SPRAY | Freq: Four times a day (QID) | RESPIRATORY_TRACT | 1 refills | Status: DC | PRN

## 2023-05-24 MED ORDER — CETIRIZINE HCL 10 MG PO TABS: 10.0000 mg | ORAL_TABLET | Freq: Every day | ORAL | 0 refills | Status: DC

## 2023-05-24 MED ORDER — OLOPATADINE HCL 0.1 % OP SOLN: 1.0000 [drp] | Freq: Two times a day (BID) | OPHTHALMIC | 4 refills | Status: DC

## 2023-05-24 MED ORDER — EPINEPHRINE 0.3 MG/0.3ML IJ SOAJ: 0.3000 mg | INTRAMUSCULAR | 2 refills | Status: AC | PRN

## 2023-05-24 MED ORDER — PANTOPRAZOLE SODIUM 40 MG PO TBEC: 40.0000 mg | DELAYED_RELEASE_TABLET | Freq: Two times a day (BID) | ORAL | 2 refills | Status: DC

## 2023-05-24 MED ORDER — BUDESONIDE-FORMOTEROL FUMARATE 160-4.5 MCG/ACT IN AERO: 2.0000 | INHALATION_SPRAY | Freq: Two times a day (BID) | RESPIRATORY_TRACT | 1 refills | Status: DC

## 2023-05-24 NOTE — Progress Notes (Signed)
[address] [place] [address] Dept: [phone]  FOLLOW UP NOTE  Patient ID: Ernest Griffin, male    DOB: 1976/02/25  Age: 47 y.o. MRN: [id number] Date of Office Visit: 05/24/2023  Assessment  Chief Complaint: Follow-up (Last two weeks SOB and voice changes. Headaches and pressure in the eyes. )  HPI Ernest Griffin is a 47 year old male who presents to the clinic for follow-up visit.  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  He is accompanied by a language interpreter was available throughout the visit.  At today's visit, he reports his asthma has been poorly controlled over the last 2 weeks with symptoms including shortness of breath and wheeze which occurs 1 hour after he wakes up in the morning.  He reports that symptoms usually last for 30 to 60 minutes before completely clearing.  He continues Symbicort 160-2 puffs twice a day and rarely uses albuterol for relief of symptoms.  Chart review indicates FMLA requests for several asthma flares, however, these have remained unknown to our clinic.  Chart review indicates absolute eosinophil count of 400 on 12/21/2021.  He is moderately interested in a biologic therapy to help control his asthma at this time.  Written information has been provided.  We discussed montelukast in detail and he reports that he is not interested in montelukast due to potential side effects.  Allergic rhinitis is reported as poorly controlled with symptoms including nasal congestion, headache, clear rhinorrhea mixed with blood flakes coming from both nostrils.  He reports that when he does blow his nose over the last several days he has noticed blood flakes which lead to bleeding which occurs from both nostrils and stops in under 5 minutes.  He reports this frequently occurs at season changes.  He  continues azelastine and Flonase daily.  He reports excellent Flonase admit duration technique.  He is not currently taking an antihistamine.  He is not currently using nasal saline rinses or nasal saline gel.  He does report that his allergies worsen when he uses nasal saline rinses his last environmental allergy testing was on 12/26/2021 and was positive to dust mite, grass pollen, tree pollen, cat, and cockroach.  He reports that he does not have any pets at his home.  He is moderately interested in allergen immunotherapy.  Written information provided at today's visit.  Reflux is reported as poorly controlled with heartburn occurring several days of the week.  He denies vomiting.  He reports that he has recently taken pantoprazole daily, however, his prescription ran out and he is no longer taking this medication.  He does report a significant decrease in his heartburn while taking pantoprazole.  He continues to avoid fish, shellfish, peanut, tree nuts, strawberry, eggplant, and legumes with no accidental ingestion or EpiPen use since his last visit to this clinic.  Chart review indicates epinephrine autoinjectors are out of date.  Epinephrine autoinjector prescription will be updated at today's visit.  His current medications are listed in the chart.  Drug Allergies:  Allergies  Allergen Reactions   Caffeine    Ibuprofen     Pruritis    Strawberry (Diagnostic) Itching    Physical Exam: BP 110/60   Pulse 84   Temp 98.6 F (37 C) (Temporal)   Resp 16   Wt 178 lb 11.2 oz (81.1 kg)  SpO2 99%   BMI 26.01 kg/m    Physical Exam Vitals reviewed.  HENT:     Head: Normocephalic.     Right Ear: Tympanic membrane normal.     Left Ear: Tympanic membrane normal.     Nose:     Comments: Bilateral nares edematous and pale with thin clear nasal drainage noted.  Pharynx normal.  Ears normal.  Eyes normal.    Mouth/Throat:     Pharynx: Oropharynx is clear.  Eyes:     Conjunctiva/sclera:  Conjunctivae normal.  Cardiovascular:     Rate and Rhythm: Normal rate and regular rhythm.     Heart sounds: Normal heart sounds. No murmur heard. Pulmonary:     Effort: Pulmonary effort is normal.     Breath sounds: Normal breath sounds.     Comments: Lungs clear to auscultation Musculoskeletal:        General: Normal range of motion.     Cervical back: Normal range of motion and neck supple.  Skin:    General: Skin is warm and dry.  Neurological:     Mental Status: He is alert and oriented to person, place, and time.  Psychiatric:        Mood and Affect: Mood normal.        Behavior: Behavior normal.        Thought Content: Thought content normal.        Judgment: Judgment normal.     Diagnostics: FVC 3.97 which is 95% of predicted value, FEV1 3.36 which is 101% of predicted value.  Spirometry indicates normal ventilatory function.   Assessment and Plan: 1. Not well controlled asthma without complication   2. Seasonal and perennial allergic rhinitis   3. Gastroesophageal reflux disease, unspecified whether esophagitis present   4. Anaphylactic shock due to food, subsequent encounter     Meds ordered this encounter  Medications   albuterol (VENTOLIN HFA) 108 (90 Base) MCG/ACT inhaler    Sig: Inhale 2 puffs into the lungs every 6 (six) hours as needed for wheezing or shortness of breath. Needs office visit    Dispense:  18 g    Refill:  1   budesonide-formoterol (SYMBICORT) 160-4.5 MCG/ACT inhaler    Sig: Inhale 2 puffs into the lungs 2 (two) times daily.    Dispense:  10.2 g    Refill:  1   cetirizine (ZYRTEC ALLERGY) 10 MG tablet    Sig: Take 1 tablet (10 mg total) by mouth daily.    Dispense:  90 tablet    Refill:  0   olopatadine (PATADAY) 0.1 % ophthalmic solution    Sig: Place 1 drop into both eyes 2 (two) times daily.    Dispense:  5 mL    Refill:  4   pantoprazole (PROTONIX) 40 MG tablet    Sig: Take 1 tablet (40 mg total) by mouth 2 (two) times daily  before a meal.    Dispense:  120 tablet    Refill:  2   fluticasone (FLONASE) 50 MCG/ACT nasal spray    Sig: Place 2 sprays into both nostrils daily.    Dispense:  16 g    Refill:  5   EPINEPHrine 0.3 mg/0.3 mL IJ SOAJ injection    Sig: Inject 0.3 mg into the muscle as needed for anaphylaxis.    Dispense:  1 each    Refill:  2    Patient Instructions  Asthma Continue Symbicort 160-2 puffs twice a day with a spacer  to prevent cough or wheeze Continue albuterol 2 puffs once every 4 hours as needed for cough or wheeze You may use albuterol 2 puffs 5 to 15 minutes before activity to decrease cough or wheeze Consider montelukast 10 mg once a day to control asthma Consider a biologic therapy to control asthma- Information provided  Allergic rhinitis Begin nasal saline gel as needed for dry nostrils Continue allergen avoidance measures directed toward grass pollen, tree pollen, dust mite, cat, and cockroach. Begin levocetirizine 5 mg once a day as needed for runny nose or itch. This will replace cetirizine Continue Flonase 2 sprays in each nostril once a day as needed for a stuffy nose.  In the right nostril, point the applicator out toward the right ear. In the left nostril, point the applicator out toward the left ear Consider saline nasal rinses as needed for nasal symptoms. Use this before any medicated nasal sprays for best result Consider allergen immunotherapy to control your allergies. This may help to control your asthma  Allergic conjunctivitis Some over the counter eye drops include Pataday one drop in each eye once a day as needed for red, itchy eyes OR Zaditor one drop in each eye twice a day as needed for red itchy eyes. Avoid eye drops that say red eye relief as they may contain medications that dry out your eyes.   Gastroesophageal reflux disease Continue lifestyle and dietary modifications. Restart pantoprazole 40 mg daily.  Take this medication at least 30 minutes before  your first meal for best results  Food allergy Continue to avoid fish, shellfish, peanuts, tree nuts, strawberry, eggplant, and legumes.  In case of an allergic reaction, take Benadryl 50 every 4 hours, and if life-threatening symptoms occur, inject with EpiPen 0.3 mg.  Call the clinic if this treatment plan is not working well for you  Follow up in 2 months or sooner if needed.   Return in about 2 months (around 07/24/2023), or if symptoms worsen or fail to improve.    Thank you for the opportunity to care for this patient.  Please do not hesitate to contact me with questions.  Thermon Leyland, FNP [facility]

## 2023-05-24 NOTE — Patient Instructions (Addendum)
Asthma Continue Symbicort 160-2 puffs twice a day with a spacer to prevent cough or wheeze Continue albuterol 2 puffs once every 4 hours as needed for cough or wheeze You may use albuterol 2 puffs 5 to 15 minutes before activity to decrease cough or wheeze Consider montelukast 10 mg once a day to control asthma Consider a biologic therapy to control asthma- Information provided  Allergic rhinitis Begin nasal saline gel as needed for dry nostrils Continue allergen avoidance measures directed toward grass pollen, tree pollen, dust mite, cat, and cockroach. Begin levocetirizine 5 mg once a day as needed for runny nose or itch. This will replace cetirizine Continue Flonase 2 sprays in each nostril once a day as needed for a stuffy nose.  In the right nostril, point the applicator out toward the right ear. In the left nostril, point the applicator out toward the left ear Consider saline nasal rinses as needed for nasal symptoms. Use this before any medicated nasal sprays for best result Consider allergen immunotherapy to control your allergies. This may help to control your asthma  Allergic conjunctivitis Some over the counter eye drops include Pataday one drop in each eye once a day as needed for red, itchy eyes OR Zaditor one drop in each eye twice a day as needed for red itchy eyes. Avoid eye drops that say red eye relief as they may contain medications that dry out your eyes.   Gastroesophageal reflux disease Continue lifestyle and dietary modifications. Restart pantoprazole 40 mg daily.  Take this medication at least 30 minutes before your first meal for best results  Food allergy Continue to avoid fish, shellfish, peanuts, tree nuts, strawberry, eggplant, and legumes.  In case of an allergic reaction, take Benadryl 50 every 4 hours, and if life-threatening symptoms occur, inject with EpiPen 0.3 mg.  Call the clinic if this treatment plan is not working well for you  Follow up in 2 months  or sooner if needed.   Control of Dust Mite Allergen Dust mites play a major role in allergic asthma and rhinitis. They occur in environments with high humidity wherever human skin is found. Dust mites absorb humidity from the atmosphere (ie, they do not drink) and feed on organic matter (including shed human and animal skin). Dust mites are a microscopic type of insect that you cannot see with the naked eye. High levels of dust mites have been detected from mattresses, pillows, carpets, upholstered furniture, bed covers, clothes, soft toys and any woven material. The principal allergen of the dust mite is found in its feces. A gram of dust may contain 1,000 mites and 250,000 fecal particles. Mite antigen is easily measured in the air during house cleaning activities. Dust mites do not bite and do not cause harm to humans, other than by triggering allergies/asthma.  Ways to decrease your exposure to dust mites in your home:  1. Encase mattresses, box springs and pillows with a mite-impermeable barrier or cover  2. Wash sheets, blankets and drapes weekly in hot water (130 F) with detergent and dry them in a dryer on the hot setting.  3. Have the room cleaned frequently with a vacuum cleaner and a damp dust-mop. For carpeting or rugs, vacuuming with a vacuum cleaner equipped with a high-efficiency particulate air (HEPA) filter. The dust mite allergic individual should not be in a room which is being cleaned and should wait 1 hour after cleaning before going into the room.  4. Do not sleep on upholstered furniture (  eg, couches).  5. If possible removing carpeting, upholstered furniture and drapery from the home is ideal. Horizontal blinds should be eliminated in the rooms where the person spends the most time (bedroom, study, television room). Washable vinyl, roller-type shades are optimal.  6. Remove all non-washable stuffed toys from the bedroom. Wash stuffed toys weekly like sheets and blankets  above.  7. Reduce indoor humidity to less than 50%. Inexpensive humidity monitors can be purchased at most hardware stores. Do not use a humidifier as can make the problem worse and are not recommended.  Reducing Pollen Exposure The American Academy of Allergy, Asthma and Immunology suggests the following steps to reduce your exposure to pollen during allergy seasons. Do not hang sheets or clothing out to dry; pollen may collect on these items. Do not mow lawns or spend time around freshly cut grass; mowing stirs up pollen. Keep windows closed at night.  Keep car windows closed while driving. Minimize morning activities outdoors, a time when pollen counts are usually at their highest. Stay indoors as much as possible when pollen counts or humidity is high and on windy days when pollen tends to remain in the air longer. Use air conditioning when possible.  Many air conditioners have filters that trap the pollen spores. Use a HEPA room air filter to remove pollen form the indoor air you breathe.  Control of Dog or Cat Allergen Avoidance is the best way to manage a dog or cat allergy. If you have a dog or cat and are allergic to dog or cats, consider removing the dog or cat from the home. If you have a dog or cat but don't want to find it a new home, or if your family wants a pet even though someone in the household is allergic, here are some strategies that may help keep symptoms at bay:  Keep the pet out of your bedroom and restrict it to only a few rooms. Be advised that keeping the dog or cat in only one room will not limit the allergens to that room. Don't pet, hug or kiss the dog or cat; if you do, wash your hands with soap and water. High-efficiency particulate air (HEPA) cleaners run continuously in a bedroom or living room can reduce allergen levels over time. Regular use of a high-efficiency vacuum cleaner or a central vacuum can reduce allergen levels. Giving your dog or cat a bath at  least once a week can reduce airborne allergen.  Control of Cockroach Allergen  Cockroach allergen has been identified as an important cause of acute attacks of asthma, especially in urban settings.  There are fifty-five species of cockroach that exist in the [place], however only three, the [place], [place] species produce allergen that can affect patients with Asthma.  Allergens can be obtained from fecal particles, egg casings and secretions from cockroaches.    Remove food sources. Reduce access to water. Seal access and entry points. Spray runways with 0.5-1% Diazinon or Chlorpyrifos Blow boric acid power under stoves and refrigerator. Place bait stations (hydramethylnon) at feeding sites.

## 2023-05-28 ENCOUNTER — Telehealth: Payer: Self-pay | Admitting: Critical Care Medicine

## 2023-05-28 NOTE — Telephone Encounter (Signed)
Copied from CRM [id number]. Topic: General - Other >> May 28, 2023  2:13 PM Everette C wrote: Reason for CRM: The patient has called to follow up on paperwork that they previously submitted related to their allergies and asthma   The patient has been told by their employer that they are in need of medical certification   The patient would like an update on the status of the paperwork's completion   Please contact when possible

## 2023-05-31 NOTE — Telephone Encounter (Signed)
Called patient and left voicemail   Patient needs a appointment (telephone visit)  Interpreter [phone]

## 2023-06-05 NOTE — Telephone Encounter (Signed)
Copied from CRM [id number]. Topic: General - Other >> Jun 04, 2023  4:10 PM Ernest Griffin wrote: Reason for CRM: The patient has called to follow up on the submission of their medical certification   The patient shares that the paperwork was initially dropped off to the practice on 05/17/23 at 4 PM  The patient shares that they have been in contact with [employer] and told that nothing has been received   Please contact further when possible

## 2023-06-06 NOTE — Telephone Encounter (Signed)
Patient has appointment to fill paperwork out next week

## 2023-06-07 ENCOUNTER — Encounter (HOSPITAL_COMMUNITY): Payer: Self-pay

## 2023-06-07 ENCOUNTER — Emergency Department (HOSPITAL_COMMUNITY)
Admission: EM | Admit: 2023-06-07 | Discharge: 2023-06-07 | Disposition: A | Discharge status: Home / Self Care | Payer: 59 | Attending: Emergency Medicine | Admitting: Emergency Medicine

## 2023-06-07 ENCOUNTER — Encounter (HOSPITAL_COMMUNITY): Payer: Self-pay | Admitting: Emergency Medicine

## 2023-06-07 ENCOUNTER — Encounter: Payer: Self-pay | Admitting: Family Medicine

## 2023-06-07 ENCOUNTER — Other Ambulatory Visit: Payer: Self-pay

## 2023-06-07 ENCOUNTER — Encounter: Payer: Self-pay | Admitting: Critical Care Medicine

## 2023-06-07 ENCOUNTER — Ambulatory Visit (HOSPITAL_COMMUNITY)
Admission: EM | Admit: 2023-06-07 | Discharge: 2023-06-07 | Disposition: A | Discharge status: Home / Self Care | Payer: 59

## 2023-06-07 DIAGNOSIS — Z7951 Long term (current) use of inhaled steroids: Secondary | ICD-10-CM | POA: Diagnosis not present

## 2023-06-07 DIAGNOSIS — Z0279 Encounter for issue of other medical certificate: Secondary | ICD-10-CM | POA: Insufficient documentation

## 2023-06-07 DIAGNOSIS — J45909 Unspecified asthma, uncomplicated: Secondary | ICD-10-CM | POA: Diagnosis present

## 2023-06-07 DIAGNOSIS — Z0289 Encounter for other administrative examinations: Secondary | ICD-10-CM | POA: Diagnosis not present

## 2023-06-07 NOTE — Discharge Instructions (Signed)
Please see your primary doctor, who can help you with exact specifications for work.

## 2023-06-07 NOTE — ED Provider Notes (Signed)
[facility]    CSN: [id number] Arrival date & time: 06/07/23  8119      History   Chief Complaint Chief Complaint  Patient presents with   Sore Throat    Pt states [facility], running nose and allergies. Pt endorse he is working on a very cold place and this is making him have this symptoms. Pt here looking for a work not that said what he has and when he can go back to work.    HPI Ernest Griffin is a 47 y.o. male.   Patient here today for a note for work.  He tells me today that he needs a note for work stating exactly what condition he has and what his top restrictions are.  Reports he was seen in [facility] yesterday where he received a note stating that he cannot work in cold environments and cannot use scissors or knives because he has left thumb pain.  He took the note to his employer who stated he needs to have a note stating the exact condition and when he can return to work.  Reports when he works in cool environments, he developed headache, sneezing, and runny nose.  He is requesting not to work in cold environments.  He sees an allergist and takes 2 nasal sprays and an oral antihistamine.  Reports his symptoms are well-controlled with the regimen except when he calls in the cold environment.    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 No pertinent surgical history.     Home Medications    Prior to Admission medications    Medication Sig Start Date End Date Taking? Authorizing Provider  albuterol (VENTOLIN HFA) 108 (90 Base) MCG/ACT inhaler Inhale 2 puffs into the lungs every 6 (six) hours as needed for wheezing or shortness of breath. Needs office visit 05/24/23   Hetty Blend, FNP  azelastine (ASTELIN) 0.1 % nasal spray Place 1-2 sprays into both nostrils 2 (two) times daily as needed (nasal drainage). Use in each nostril as directed 07/10/22   Storm Frisk, MD  budesonide-formoterol [facility]) 160-4.5 MCG/ACT inhaler Inhale 2 puffs into the lungs 2 (two) times daily. 05/24/23   Hetty Blend, FNP  cetirizine (ZYRTEC ALLERGY) 10 MG tablet Take 1 tablet (10 mg total) by mouth daily. 05/24/23   Hetty Blend, FNP  EPINEPHrine 0.3 mg/0.3 mL IJ SOAJ injection Inject 0.3 mg into the muscle as needed for anaphylaxis. 05/24/23   Hetty Blend, FNP  fluticasone (FLONASE) 50 MCG/ACT nasal spray Place 2 sprays into both nostrils daily. 05/24/23   Hetty Blend, FNP  olopatadine (PATADAY) 0.1 % ophthalmic solution Place 1 drop into both eyes 2 (two) times daily. 05/24/23   Hetty Blend, FNP  pantoprazole (PROTONIX) 40 MG tablet Take 1 tablet (40 mg total) by mouth 2 (two) times daily before a meal. 05/24/23   Ambs, Norvel Richards, FNP  Spacer/Aero-Holding Chambers (AEROCHAMBER PLUS)  inhaler Use as instructed 09/27/21   Ellamae Sia, DO  levocetirizine (XYZAL) 5 MG tablet Take 1 tablet (5 mg total) by mouth every evening. To decrease allergy symptoms 04/02/19 06/16/19  Cain Saupe, MD    Family History Family History  Problem Relation Age of Onset   Healthy Father    Allergic rhinitis Daughter     Social History Social History   Tobacco Use   Smoking status: Never   Smokeless tobacco: Never  Vaping Use   Vaping Use: Never used  Substance Use Topics   Alcohol use: No    Alcohol/week: 0.0 standard drinks of alcohol   Drug use: No     Allergies   Caffeine, Ibuprofen, and Strawberry (diagnostic)   Review of Systems Review  of Systems Per HPI  Physical Exam Triage Vital Signs ED Triage Vitals [06/07/23 1007]  Enc Vitals Group     BP 126/79     Pulse Rate 72     Resp 18     Temp 97.8 F (36.6 C)     Temp Source Oral     SpO2 99 %     Weight      Height      Head Circumference      Peak Flow      Pain Score 0     Pain Loc      Pain Edu?      Excl. in GC?    No data found.  Updated Vital Signs BP 126/79 (BP Location: Right Arm)   Pulse 72   Temp 97.8 F (36.6 C) (Oral)   Resp 18   SpO2 99%   Visual Acuity Right Eye Distance:   Left Eye Distance:   Bilateral Distance:    Right Eye Near:   Left Eye Near:    Bilateral Near:     Physical Exam Vitals and nursing note reviewed.  Constitutional:      General: He is not in acute distress.    Appearance: He is well-developed. He is not toxic-appearing.  HENT:     Mouth/Throat:     Mouth: Mucous membranes are moist.     Pharynx: Oropharynx is clear.  Cardiovascular:     Rate and Rhythm: Normal rate.  Pulmonary:     Effort: Pulmonary effort is normal. No respiratory distress.     Comments: Patient speaking in complete sentences without accessory muscle use Skin:    Coloration: Skin is not pale.     Findings: No erythema or rash.  Neurological:     Mental Status: He is alert and oriented to person, place, and time.      UC Treatments / Results  Labs (all labs ordered are listed, but only abnormal results are displayed) Labs Reviewed - No data to display  EKG   Radiology No results found.  Procedures Procedures (including critical care time)  Medications Ordered in UC Medications - No data to display  Initial Impression / Assessment and Plan / UC Course  I have reviewed the triage vital signs and the nursing notes.  Pertinent labs & imaging results that were available during my care of the patient were reviewed by me and considered in my medical decision making (see chart for details).   Patient is well-appearing,  normotensive, afebrile, not tachycardic, not tachypneic, oxygenating well on room air.    1. Encounter to obtain excuse from work I discussed with patient that we will not be able to give work note with  his stated requests/work restrictions today due to our policy Recommended he reach out to either allergist or PCP for a more specific note for work with work restrictions  The patient was given the opportunity to ask questions.  All questions answered to their satisfaction.  The patient is in agreement to this plan.    Final Clinical Impressions(s) / UC Diagnoses   Final diagnoses:  Encounter to obtain excuse from work   Discharge Instructions   None    ED Prescriptions   None    PDMP not reviewed this encounter.   Valentino Nose, NP 06/07/23 1028

## 2023-06-07 NOTE — ED Triage Notes (Signed)
Pt states he was [facility] for [facility], running nose and allergies. Pt endorse he is working on a very cold place and this is making him have this symptoms. Pt here looking for a work not that said what he has and when he can go back to work.

## 2023-06-07 NOTE — ED Triage Notes (Signed)
Was just seen at UC this morning, but is saying he is having asthma and allergies that act up when he enters cold room that is less than 40 degrees. Currently ambulatory with no distress while walking or talking. Also endorses sore throat runny nose and general allergies.

## 2023-06-07 NOTE — ED Provider Notes (Signed)
[place] EMERGENCY DEPARTMENT AT [facility] Provider Note   CSN: [id number] Arrival date & time: 06/07/23  2037     History  Chief Complaint  Patient presents with   Asthma    Ernest Griffin is a 47 y.o. male   Was just seen at UC this morning, but is saying he is having asthma and allergies that act up when he enters cold room that is less than 40 degrees.  He works at [employer] he states that he needs a note for work specifying the temperature at which he is okay to work.  Currently ambulatory with no distress while walking or talking.  He endorses general allergy symptoms but has no acute complaints at this time.  Denies any shortness of breath or chest pain.  States he just came here directed from urgent care for work note.      Asthma       Home Medications Prior to Admission medications   Medication Sig Start Date End Date Taking? Authorizing Provider  albuterol (VENTOLIN HFA) 108 (90 Base) MCG/ACT inhaler Inhale 2 puffs into the lungs every 6 (six) hours as needed for wheezing or shortness of breath. Needs office visit 05/24/23   Hetty Blend, FNP  azelastine (ASTELIN) 0.1 % nasal spray Place 1-2 sprays into both nostrils 2 (two) times daily as needed (nasal drainage). Use in each nostril as directed 07/10/22   Storm Frisk, MD  budesonide-formoterol [facility]) 160-4.5 MCG/ACT inhaler Inhale 2 puffs into the lungs 2 (two) times daily. 05/24/23   Hetty Blend, FNP  cetirizine (ZYRTEC ALLERGY) 10 MG tablet Take 1 tablet (10 mg total) by mouth daily. 05/24/23   Hetty Blend, FNP  EPINEPHrine 0.3 mg/0.3 mL IJ SOAJ injection Inject 0.3 mg into the muscle as needed for anaphylaxis. 05/24/23   Hetty Blend, FNP  fluticasone (FLONASE) 50 MCG/ACT nasal spray Place 2 sprays into both nostrils daily. 05/24/23   Hetty Blend, FNP  olopatadine (PATADAY) 0.1 % ophthalmic solution Place 1 drop into both eyes 2 (two) times daily. 05/24/23   Hetty Blend, FNP  pantoprazole (PROTONIX)  40 MG tablet Take 1 tablet (40 mg total) by mouth 2 (two) times daily before a meal. 05/24/23   Ambs, Norvel Richards, FNP  Spacer/Aero-Holding Chambers (AEROCHAMBER PLUS) inhaler Use as instructed 09/27/21   Ellamae Sia, DO  levocetirizine (XYZAL) 5 MG tablet Take 1 tablet (5 mg total) by mouth every evening. To decrease allergy symptoms 04/02/19 06/16/19  Fulp, Hewitt Shorts, MD      Allergies    Caffeine, Ibuprofen, and Strawberry (diagnostic)    Review of Systems   Review of Systems A 10 point review of systems was performed and is negative unless otherwise reported in HPI.  Physical Exam Updated Vital Signs BP (!) 131/91   Pulse 76   Temp 98.1 F (36.7 C)   Resp 16   SpO2 100%  Physical Exam General: Normal appearing male, sitting in bed.  HEENT: Sclera anicteric, MMM, trachea midline.  Cardiology: RRR, no murmurs/rubs/gallops.  Resp: Normal respiratory rate and effort. Abd: Soft, non-tender, non-distended.  GU: Deferred. MSK:  Extremities without deformity. Normal gait. Skin: warm, dry. Neuro: A&Ox4, CNs II-XII grossly intact. MAEs.  Psych: Normal mood and affect.   ED Results / Procedures / Treatments   Labs (all labs ordered are listed, but only abnormal results are displayed) Labs Reviewed - No data to display  EKG None  Radiology No results  found.  Procedures Procedures    Medications Ordered in ED Medications - No data to display  ED Course/ Medical Decision Making/ A&P                          Medical Decision Making   MDM:    Patient with no acute medical complaints at this time.  He states he has Flonase, inhalers at home already.  He is in no respiratory stress and he is vitally stable.  No hypoxia.  From a medical standpoint I believe patient is very stable and does not require any intervention.  I explained to patient that unfortunately this is not a request of the emergency department can for fill.  I do believe he needs to follow this PCP for more specific  work-related long-term restrictions.  I offered patient a note for work to be excused over the next couple of days at that would help but he states that the issue is that he does need to go to work, he just does not feel like he can work in those temperatures.  I encourage patient to follow-up with his primary care physician as soon as possible.  He states he has an appointment with his gastroenterologist on Tuesday.  I encouraged him to make an appoint with his PCP within the next week.      Additional history obtained from chart review.   Social Determinants of Health: Patient lives independently   Disposition:  DC instructed to f/u with PCP. DC w/ discharge instructions/return precautions. All questions answered to patient's satisfaction.    Co morbidities that complicate the patient evaluation  Past Medical History:  Diagnosis Date   Asthma      Medicines No orders of the defined types were placed in this encounter.   I have reviewed the patients home medicines and have made adjustments as needed  Problem List / ED Course: Problem List Items Addressed This Visit   None Visit Diagnoses     Encounter to obtain excuse from work    -  Primary                   This note was created using dictation software, which may contain spelling or grammatical errors.    Loetta Rough, MD 06/07/23 [phone]

## 2023-06-11 NOTE — Progress Notes (Unsigned)
Established Patient Office Visit  Subjective:  Patient ID: Ernest Griffin, male    DOB: 07/23/1976  Age: 47 y.o. MRN: [id number] Virtual Visit via video note  I connected with Ernest Griffin on 06/12/23 at  9:30 AM EDT by video and verified that I am speaking with the correct person using two identifiers.   Consent:  I discussed the limitations, risks, security and privacy concerns of performing an evaluation and management service by video and the availability of in person appointments. I also discussed with the patient that there may be a patient responsible charge related to this service. The patient expressed understanding and agreed to proceed.  Location of patient: Patient at home  Location of provider: I am in my office  Persons participating in the televisit with the patient.  No one else on the call     CC:  Paperwork and asthma follow-up  HPI  12/2021  this visit was accomplished by Arabic interpreter Claudine #[id number] on video Ernest Griffin presents for primary care follow-up.  Patient was last seen in November when he had a left lower extremity injury.  He subsequently got in with his orthopedist who did an MRI of the left lower extremity which showed a stress fracture of the tibia.  This healed on its own secondarily and the pain in the leg is now gone.  We filled out his FMLA paperwork in November it was my impression it was just for this leg injury however the patient wished to also include frequent absences that may occur as often as 1 time a week for his asthma.  He has significant allergic factors and has since been seen twice by allergy and asthma.  He was seen end of October and then again just this month.  Extensive testing has shown he has a severe shellfish allergy and also has allergies to environmental grasses trees and cats.  The patient states he will occasionally miss work on average over a 23-month interval 1 time a week because of this.  He works in a Acupuncturist and  a Estate agent.  Patient also complains of left upper quadrant pain is been going on for about 6 weeks is progressively worse he has been to the emergency room twice for this.  No imaging is yet to occur.  He had a virtual visit with our PA last week and they ordered gastroenterology referral and Protonix however he never received the prescription for the Protonix.  When he went to the emergency room they did give him Carafate and he does have this medication.  He also notes the pain is gotten much worse in the left upper quadrant though he denies any nausea or vomiting.  He does not smoke or drink alcohol.  On arrival blood pressure is good 118/77.  He has been in the [place] for 7 years immigrating from [place].  He denies any abdominal pain while in [place].  He has never been tested for H. pylori.  He does have a pending appoint with gastroenterology but has not yet to be scheduled.   8/7 Patient seen return follow-up last visit was in January.  His left upper quadrant abdominal pain continues.  He is yet to achieve a CT scan.  He declined a GI referral in the call.  This visit was assisted by Arabic interpreter clonidine [id number]  Patient also is due a colon cancer screening.  He states that orange juice and chocolate make his symptoms worse  with his reflux.  He states the pantoprazole is beneficial.  Patient's asthma symptoms are stable at this time.  He is getting his Symbicort at [facility] but other medicines at the [facility]. Patient complains of pain above the left ear with twitching  12/20 Patient here for FMLA paperwork history of asthma he flares up about 3-4 times every month and requires to being out of work for at least a day with each flareup he is under some control with an allergist and follows with him.  He would like a flu vaccine will come in later for this.  He needs a work note for being out of work today to make this call.   06/12/23 This patient is seen by way of video visit.   He has had to be out of work for about a week due to an asthma flareup.  He states that he works between 34 and 45 degrees freezer work at the [employer] it flares his asthma.  He is also been seen by asthma and allergy he has multiple food and environmental allergies and has been offered allergy immunotherapy he is pondering this.  He maintains all his asthma medications.  He needs a letter return to work today as he is improving.  He would like to be limited to 55 degrees and above temperatures where his work is.  He has follow-up with asthma and allergy has all his breathing medications. He has an FMLA form from [place] that needs to be filled out. Past Medical History:  Diagnosis Date   Asthma     No past surgical history on file.  Family History  Problem Relation Age of Onset   Healthy Father    Allergic rhinitis Daughter     Social History   Socioeconomic History   Marital status: Married    Spouse name: Not on file   Number of children: Not on file   Years of education: Not on file   Highest education level: Not on file  Occupational History   Occupation: [employer]   Tobacco Use   Smoking status: Never   Smokeless tobacco: Never  Vaping Use   Vaping Use: Never used  Substance and Sexual Activity   Alcohol use: No    Alcohol/week: 0.0 standard drinks of alcohol   Drug use: No   Sexual activity: Yes  Other Topics Concern   Not on file  Social History Narrative   Works at [employer]    From [place]    Social Determinants of Corporate investment banker Strain: Not on file  Food Insecurity: Not on file  Transportation Needs: Not on file  Physical Activity: Not on file  Stress: Not on file  Social Connections: Not on file  Intimate Partner Violence: Not on file    Outpatient Medications Prior to Visit  Medication Sig Dispense Refill   albuterol (VENTOLIN HFA) 108 (90 Base) MCG/ACT inhaler Inhale 2 puffs into the lungs every 6 (six) hours as needed  for wheezing or shortness of breath. Needs office visit 18 g 1   azelastine (ASTELIN) 0.1 % nasal spray Place 1-2 sprays into both nostrils 2 (two) times daily as needed (nasal drainage). Use in each nostril as directed 30 mL 5   budesonide-formoterol (SYMBICORT) 160-4.5 MCG/ACT inhaler Inhale 2 puffs into the lungs 2 (two) times daily. 10.2 g 1   cetirizine (ZYRTEC ALLERGY) 10 MG tablet Take 1 tablet (10 mg total) by mouth daily. 90 tablet 0  EPINEPHrine 0.3 mg/0.3 mL IJ SOAJ injection Inject 0.3 mg into the muscle as needed for anaphylaxis. 1 each 2   fluticasone (FLONASE) 50 MCG/ACT nasal spray Place 2 sprays into both nostrils daily. 16 g 5   olopatadine (PATADAY) 0.1 % ophthalmic solution Place 1 drop into both eyes 2 (two) times daily. 5 mL 4   pantoprazole (PROTONIX) 40 MG tablet Take 1 tablet (40 mg total) by mouth 2 (two) times daily before a meal. 120 tablet 2   Spacer/Aero-Holding Chambers (AEROCHAMBER PLUS) inhaler Use as instructed 1 each 2   No facility-administered medications prior to visit.    Allergies  Allergen Reactions   Caffeine    Ibuprofen     Pruritis    Strawberry (Diagnostic) Itching    ROS Review of Systems  Constitutional:  Negative for chills, diaphoresis and fever.  HENT:  Negative for congestion, hearing loss, nosebleeds, postnasal drip, rhinorrhea, sore throat, tinnitus, trouble swallowing and voice change.   Eyes:  Negative for photophobia, redness and itching.  Respiratory:  Negative for cough, shortness of breath, wheezing and stridor.   Cardiovascular:  Negative for chest pain, palpitations and leg swelling.  Gastrointestinal:  Negative for abdominal distention, abdominal pain, anal bleeding, blood in stool, constipation, diarrhea, nausea, rectal pain and vomiting.       Reflux symptoms  Endocrine: Negative for polydipsia.  Genitourinary:  Negative for dysuria, flank pain, frequency, hematuria and urgency.  Musculoskeletal:  Negative for back  pain, myalgias and neck pain.  Skin:  Negative for rash.  Allergic/Immunologic: Negative for environmental allergies.  Neurological:  Negative for dizziness, tremors, seizures, weakness and headaches.  Hematological:  Does not bruise/bleed easily.  Psychiatric/Behavioral: Negative.  Negative for suicidal ideas. The patient is not nervous/anxious.       Objective:    No exam this is a video visit Patient seen on video in no distress There were no vitals taken for this visit. Wt Readings from Last 3 Encounters:  05/24/23 178 lb 11.2 oz (81.1 kg)  07/10/22 173 lb 9.6 oz (78.7 kg)  06/08/22 179 lb 9.6 oz (81.5 kg)     Health Maintenance Due  Topic Date Due   COLON CANCER SCREENING ANNUAL FOBT  Never done    There are no preventive care reminders to display for this patient.  No results found for: "TSH" Lab Results  Component Value Date   WBC 5.2 12/21/2021   HGB 13.4 12/21/2021   HCT 38.8 (L) 12/21/2021   MCV 86.6 12/21/2021   PLT 239 12/21/2021   Lab Results  Component Value Date   NA 136 12/21/2021   K 4.1 12/21/2021   CO2 28 12/21/2021   GLUCOSE 88 12/21/2021   BUN 10 12/21/2021   CREATININE 1.08 12/21/2021   BILITOT 0.5 12/21/2021   ALKPHOS 79 12/21/2021   AST 25 12/21/2021   ALT 23 12/21/2021   PROT 7.3 12/21/2021   ALBUMIN 4.1 12/21/2021   CALCIUM 9.2 12/21/2021   ANIONGAP 4 (L) 12/21/2021   No results found for: "CHOL" No results found for: "HDL" No results found for: "LDLCALC" No results found for: "TRIG" No results found for: "CHOLHDL" Lab Results  Component Value Date   HGBA1C 5.60 01/17/2016      Assessment & Plan:   Problem List Items Addressed This Visit       Respiratory   Asthma, moderate persistent    Has seen allergy has multiple allergies offering him allergy immunotherapy he is considering this continue all  other asthma medicines and follow-up with asthma and allergy  I wrote a letter indicating 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 38 minutes spent extra time needed for filling out paperwork  Follow Up Instructions: Continue with follow-up in person Letter is issued and FMLA form filled out I discussed the assessment and treatment plan with the patient. The patient was provided an opportunity to ask questions and all were answered. The patient agreed with the plan and demonstrated an understanding of the instructions.   The patient was advised to call back or seek an in-person evaluation if the symptoms worsen or if the condition fails to improve as anticipated.  I provided  minutes of non-face-to-face time during this encounter  including  median intraservice time , review of notes, labs, imaging, medications  and explaining diagnosis and management to the patient .      Shan Levans, MD

## 2023-06-12 ENCOUNTER — Telehealth: Payer: Self-pay | Admitting: Critical Care Medicine

## 2023-06-12 ENCOUNTER — Telehealth (HOSPITAL_BASED_OUTPATIENT_CLINIC_OR_DEPARTMENT_OTHER): Payer: 59 | Admitting: Critical Care Medicine

## 2023-06-12 ENCOUNTER — Encounter: Payer: Self-pay | Admitting: Critical Care Medicine

## 2023-06-12 DIAGNOSIS — J4540 Moderate persistent asthma, uncomplicated: Secondary | ICD-10-CM

## 2023-06-12 DIAGNOSIS — T7800XD Anaphylactic reaction due to unspecified food, subsequent encounter: Secondary | ICD-10-CM | POA: Diagnosis not present

## 2023-06-12 DIAGNOSIS — K219 Gastro-esophageal reflux disease without esophagitis: Secondary | ICD-10-CM | POA: Diagnosis not present

## 2023-06-12 DIAGNOSIS — Z029 Encounter for administrative examinations, unspecified: Secondary | ICD-10-CM | POA: Insufficient documentation

## 2023-06-12 NOTE — Assessment & Plan Note (Signed)
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

## 2023-06-12 NOTE — Assessment & Plan Note (Signed)
Patient has an EpiPen available and knows which foods to avoid

## 2023-06-12 NOTE — Addendum Note (Signed)
Addended by: Shan Levans E on: 06/12/2023 12:37 PM   Modules accepted: Level of Service

## 2023-06-12 NOTE — Telephone Encounter (Signed)
Copied from CRM [id number]. Topic: General - Other >> Jun 12, 2023  2:59 PM Carrielelia G wrote: Pt Mace calling to give name and fax number of company to fax FMLA papers too:  [employer]  fax  [phone]   ofc # [phone]

## 2023-06-12 NOTE — Assessment & Plan Note (Signed)
Continue pantoprazole. °

## 2023-06-12 NOTE — Assessment & Plan Note (Signed)
FMLA form and letter produced for the patient

## 2023-06-13 NOTE — Telephone Encounter (Signed)
Paperwork was successfully faxed in yesterday

## 2023-06-19 NOTE — Telephone Encounter (Signed)
Pt is calling in because the paperwork that was sent to [employer] is missing a page. Pt says he is unsure which pg, but says [employer] said it was missing.

## 2023-06-20 ENCOUNTER — Telehealth: Payer: Self-pay

## 2023-06-20 NOTE — Telephone Encounter (Signed)
Called patient and let him know that paperwork was re faxed   Interpreter [phone]

## 2023-06-20 NOTE — Telephone Encounter (Signed)
Called patient and let him know that paperwork was re faxed   Interpreter id [phone]

## 2023-06-20 NOTE — Telephone Encounter (Signed)
Copied from CRM [id number]. Topic: General - Other >> Jun 20, 2023  3:18 PM Lennox Pippins wrote: Patient called in regards to his FMLA paperwork that was sent in on JULY 10TH. Patient stated Dr Delford Field missed filing out some paperwork for patient & he wanted to schedule an appointment to go over paperwork with Dr. Delford Field and he needs the appointment before July 30th. Next appt that I am showing is not until November, patient needs sooner appt to discuss FMLA. Please advise. Patients callback # [phone]

## 2023-06-21 ENCOUNTER — Encounter: Payer: Self-pay | Admitting: Critical Care Medicine

## 2023-06-22 ENCOUNTER — Telehealth: Payer: Self-pay | Admitting: Critical Care Medicine

## 2023-06-22 NOTE — Telephone Encounter (Signed)
Patient called in while I was on the phone, [facility] teams me ( Dominique Allred)  I told them that he can pick forms up today or monday and that I re faxed them again today.

## 2023-06-22 NOTE — Telephone Encounter (Signed)
Copied from CRM [id number]. Topic: General - Other >> Jun 21, 2023  4:37 PM Ja-Kwan M wrote: Reason for CRM: Pt stated he was told that the [facility] paperwork was not received so he will come in to the office tomorrow to pick it up. Pt requests call back. Cb# [phone]

## 2023-06-26 NOTE — Telephone Encounter (Signed)
Carly pls pull fax file I am certain I filled out this pt fmla paper work did we send to [employer]?

## 2023-06-28 ENCOUNTER — Telehealth: Payer: Self-pay

## 2023-06-28 NOTE — Telephone Encounter (Signed)
error 

## 2023-06-28 NOTE — Telephone Encounter (Signed)
Called patient and left voicemail regarding FMLA paperwork being picked up as stated before I have already re faxed paperwork  again and confirm has been received   Interpreter id [phone]

## 2023-07-02 ENCOUNTER — Telehealth: Payer: Self-pay

## 2023-07-02 NOTE — Telephone Encounter (Signed)
Patient has not reached out or picked paperwork up, paperwork will be mailed to address on file

## 2023-07-26 ENCOUNTER — Ambulatory Visit: Payer: 59 | Admitting: Family Medicine

## 2023-07-26 NOTE — Progress Notes (Deleted)
[address] [place] [address] Dept: [phone]  FOLLOW UP NOTE  Patient ID: Ernest Griffin, male    DOB: [birth year]  Age: 47 y.o. MRN: [id number] Date of Office Visit: 07/26/2023  Assessment  Chief Complaint: No chief complaint on file.  HPI Ernest Griffin is a 47 year old male who presents to the clinic for follow-up visit.  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   Drug Allergies:  Allergies  Allergen Reactions   Caffeine    Ibuprofen     Pruritis    Strawberry (Diagnostic) Itching    Physical Exam: There were no vitals taken for this visit.   Physical Exam  Diagnostics:    Assessment and Plan: No diagnosis found.  No orders of the defined types were placed in this encounter.   There are no Patient Instructions on file for this visit.  No follow-ups on file.    Thank you for the opportunity to care for this patient.  Please do not hesitate to contact me with questions.  Thermon Leyland, FNP [facility]

## 2023-09-17 ENCOUNTER — Encounter (HOSPITAL_COMMUNITY): Payer: Self-pay

## 2023-09-17 ENCOUNTER — Ambulatory Visit (HOSPITAL_COMMUNITY)
Admission: EM | Admit: 2023-09-17 | Discharge: 2023-09-17 | Disposition: A | Discharge status: Home / Self Care | Payer: 59 | Attending: Family Medicine | Admitting: Family Medicine

## 2023-09-17 DIAGNOSIS — R109 Unspecified abdominal pain: Secondary | ICD-10-CM | POA: Insufficient documentation

## 2023-09-17 LAB — COMPREHENSIVE METABOLIC PANEL
ALT: 23 U/L (ref 0–44)
AST: 25 U/L (ref 15–41)
Albumin: 3.8 g/dL (ref 3.5–5.0)
Alkaline Phosphatase: 60 U/L (ref 38–126)
Anion gap: 7 (ref 5–15)
BUN: 9 mg/dL (ref 6–20)
CO2: 25 mmol/L (ref 22–32)
Calcium: 9.1 mg/dL (ref 8.9–10.3)
Chloride: 107 mmol/L (ref 98–111)
Creatinine, Ser: 1.1 mg/dL (ref 0.61–1.24)
GFR, Estimated: >60 mL/min (ref >60)
Glucose, Bld: 110 mg/dL — ABNORMAL HIGH (ref 70–99)
Potassium: 3.9 mmol/L (ref 3.5–5.1)
Sodium: 139 mmol/L (ref 135–145)
Total Bilirubin: 0.8 mg/dL (ref 0.3–1.2)
Total Protein: 7 g/dL (ref 6.5–8.1)

## 2023-09-17 LAB — CBC
HCT: 38.8 % — ABNORMAL LOW (ref 39.0–52.0)
Hemoglobin: 13.2 g/dL (ref 13.0–17.0)
MCH: 29 pg (ref 26.0–34.0)
MCHC: 34 g/dL (ref 30.0–36.0)
MCV: 85.3 fL (ref 80.0–100.0)
Platelets: 212 10*3/uL (ref 150–400)
RBC: 4.55 MIL/uL (ref 4.22–5.81)
RDW: 12.6 % (ref 11.5–15.5)
WBC: 4.7 10*3/uL (ref 4.0–10.5)
nRBC: 0 % (ref 0.0–0.2)

## 2023-09-17 LAB — TSH: TSH: 0.472 u[IU]/mL (ref 0.350–4.500)

## 2023-09-17 MED ORDER — PANTOPRAZOLE SODIUM 40 MG PO TBEC: 40.0000 mg | DELAYED_RELEASE_TABLET | Freq: Every day | ORAL | 1 refills | Status: DC

## 2023-09-17 NOTE — ED Provider Notes (Signed)
[facility]    CSN: [id number] Arrival date & time: 09/17/23  1901      History   Chief Complaint Chief Complaint  Patient presents with   Flank Pain    HPI Ernest Griffin is a 47 y.o. male.    Flank Pain  Here for left side pain has been going on for a long time, but has been worse in the last week.  He states that it hurts worse when he eats things like meat or liver but also acid and things like orange or some apples.  No nausea or vomiting and no fever.  He feels like he might have a rash on the left side, but is not itching there.  Bowel movements are normal per his history.  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 No pertinent surgical history.     Home Medications    Prior to Admission medications   Medication Sig Start Date End Date Taking? Authorizing Provider  pantoprazole (PROTONIX) 40 MG tablet Take 1 tablet (40 mg total) by mouth daily. 09/17/23  Yes Zenia Resides, MD  albuterol (VENTOLIN HFA) 108 (90 Base) MCG/ACT inhaler Inhale 2 puffs into the lungs every 6 (six) hours as needed for wheezing or shortness of breath. Needs office visit 05/24/23   Hetty Blend, FNP  azelastine (ASTELIN) 0.1 % nasal spray Place 1-2 sprays into both nostrils 2 (two) times daily as needed (nasal drainage). Use in each  nostril as directed 07/10/22   Storm Frisk, MD  budesonide-formoterol [facility]) 160-4.5 MCG/ACT inhaler Inhale 2 puffs into the lungs 2 (two) times daily. 05/24/23   Hetty Blend, FNP  cetirizine (ZYRTEC ALLERGY) 10 MG tablet Take 1 tablet (10 mg total) by mouth daily. 05/24/23   Hetty Blend, FNP  EPINEPHrine 0.3 mg/0.3 mL IJ SOAJ injection Inject 0.3 mg into the muscle as needed for anaphylaxis. 05/24/23   Hetty Blend, FNP  fluticasone (FLONASE) 50 MCG/ACT nasal spray Place 2 sprays into both nostrils daily. 05/24/23   Hetty Blend, FNP  olopatadine (PATADAY) 0.1 % ophthalmic solution Place 1 drop into both eyes 2 (two) times daily. 05/24/23   Hetty Blend, FNP  Spacer/Aero-Holding Chambers (AEROCHAMBER PLUS) inhaler Use as instructed 09/27/21   Ellamae Sia, DO  levocetirizine (XYZAL) 5 MG tablet Take 1 tablet (5 mg total) by mouth every evening. To decrease allergy symptoms 04/02/19 06/16/19  Cain Saupe, MD    Family History Family History  Problem Relation Age of Onset   Healthy Father    Allergic rhinitis Daughter     Social History Social History   Tobacco Use   Smoking status: Never   Smokeless tobacco: Never  Vaping Use   Vaping status: Never Used  Substance Use Topics   Alcohol use: No  Alcohol/week: 0.0 standard drinks of alcohol   Drug use: No     Allergies   Caffeine and Strawberry (diagnostic)   Review of Systems Review of Systems  Genitourinary:  Positive for flank pain.     Physical Exam Triage Vital Signs ED Triage Vitals  Encounter Vitals Group     BP 09/17/23 1947 110/73     Systolic BP Percentile --      Diastolic BP Percentile --      Pulse Rate 09/17/23 1947 68     Resp 09/17/23 1947 16     Temp 09/17/23 1947 98.2 F (36.8 C)     Temp Source 09/17/23 1947 Oral     SpO2 09/17/23 1947 97 %     Weight 09/17/23 1947 179 lb (81.2 kg)     Height 09/17/23 1947 5\' 8"  (1.727 m)     Head Circumference --      Peak Flow --      Pain Score  09/17/23 1946 4     Pain Loc --      Pain Education --      Exclude from Growth Chart --    No data found.  Updated Vital Signs BP 110/73 (BP Location: Left Arm)   Pulse 68   Temp 98.2 F (36.8 C) (Oral)   Resp 16   Ht 5\' 8"  (1.727 m)   Wt 81.2 kg   SpO2 97%   BMI 27.22 kg/m   Visual Acuity Right Eye Distance:   Left Eye Distance:   Bilateral Distance:    Right Eye Near:   Left Eye Near:    Bilateral Near:     Physical Exam Vitals reviewed.  Constitutional:      General: He is not in acute distress.    Appearance: He is not ill-appearing, toxic-appearing or diaphoretic.  Cardiovascular:     Rate and Rhythm: Normal rate and regular rhythm.     Heart sounds: No murmur heard. Pulmonary:     Effort: Pulmonary effort is normal.     Breath sounds: Normal breath sounds.  Abdominal:     Palpations: Abdomen is soft.     Tenderness: There is no right CVA tenderness or left CVA tenderness.     Comments: There is some tenderness over the mid axillary and posterior axillary line over the very lower rib cage on the left.  No rash and no deformity.  No erythema or swelling.  Skin:    Capillary Refill: Capillary refill takes less than 2 seconds.     Coloration: Skin is not jaundiced or pale.  Neurological:     General: No focal deficit present.     Mental Status: He is alert and oriented to person, place, and time.  Psychiatric:        Behavior: Behavior normal.      UC Treatments / Results  Labs (all labs ordered are listed, but only abnormal results are displayed) Labs Reviewed  CBC  COMPREHENSIVE METABOLIC PANEL  TSH    EKG   Radiology No results found.  Procedures Procedures (including critical care time)  Medications Ordered in UC Medications - No data to display  Initial Impression / Assessment and Plan / UC Course  I have reviewed the triage vital signs and the nursing notes.  Pertinent labs & imaging results that were available during my care of  the patient were reviewed by me and considered in my medical decision making (see chart for details).  Protonix is sent in since he has been off that for some time.  I have asked him to keep a food diary.  He will follow-up with primary care; he states he was supposed to be seeing someone for an endoscopy.  I have asked him to please see primary care about referrals that are appropriate.  If he worsens anyway he is to go to the ER.  CBC, chemistry profile, and TSH are drawn.  Will notify him if anything is significantly abnormal Final Clinical Impressions(s) / UC Diagnoses   Final diagnoses:  Left flank pain     Discharge Instructions      Pantoprazole 40 mg--take 1 daily for stomach acid.  Keep a food diary, with notes on what seems to make you feel worse.  Please follow-up with your primary care.  If your symptoms intensify or worsen a lot, please go to the emergency room for further evaluation  We have drawn blood to check your blood counts, kidney and liver function and thyroid function, and a electrolytes and sugar.  Staff will notify you if anything is significantly abnormal     ED Prescriptions     Medication Sig Dispense Auth. Provider   pantoprazole (PROTONIX) 40 MG tablet Take 1 tablet (40 mg total) by mouth daily. 30 tablet Burley Kopka, Janace Aris, MD      PDMP not reviewed this encounter.   Zenia Resides, MD 09/17/23 2022

## 2023-09-17 NOTE — Discharge Instructions (Signed)
Pantoprazole 40 mg--take 1 daily for stomach acid.  Keep a food diary, with notes on what seems to make you feel worse.  Please follow-up with your primary care.  If your symptoms intensify or worsen a lot, please go to the emergency room for further evaluation  We have drawn blood to check your blood counts, kidney and liver function and thyroid function, and a electrolytes and sugar.  Staff will notify you if anything is significantly abnormal

## 2023-09-17 NOTE — ED Notes (Signed)
Pt ambulated out for discharge at this time. Pt's respirations regular/unlabored/even, gait is steady, Aox4, appears to be in no distress at this time. Molli Hazard CMA reviewed AVS with pt before departure.

## 2023-09-17 NOTE — ED Triage Notes (Signed)
Patient here today with c/o left side flank pain X 1 week. Pain increases after eating. No other symptoms. Patient states that it feels swollen.

## 2023-10-04 ENCOUNTER — Telehealth: Payer: 59 | Admitting: Physician Assistant

## 2023-10-24 ENCOUNTER — Telehealth: Payer: 59 | Admitting: Physician Assistant

## 2023-10-29 NOTE — Progress Notes (Unsigned)
Established Patient Office Visit  Subjective:  Patient ID: Ernest Griffin, male    DOB: [birth year]  Age: 47 y.o. MRN: [id number] Virtual Visit via video note  I connected with Ernest Griffin on 10/29/23 at  9:30 AM EST by video and verified that I am speaking with the correct person using two identifiers.   Consent:  I discussed the limitations, risks, security and privacy concerns of performing an evaluation and management service by video and the availability of in person appointments. I also discussed with the patient that there may be a patient responsible charge related to this service. The patient expressed understanding and agreed to proceed.  Location of patient: Patient at home  Location of provider: I am in my office  Persons participating in the televisit with the patient.  No one else on the call     CC:  Paperwork and asthma follow-up  HPI  12/2021  this visit was accomplished by Arabic interpreter Claudine #[id number] on video Ernest Griffin presents for primary care follow-up.  Patient was last seen in November when he had a left lower extremity injury.  He subsequently got in with his orthopedist who did an MRI of the left lower extremity which showed a stress fracture of the tibia.  This healed on its own secondarily and the pain in the leg is now gone.  We filled out his FMLA paperwork in November it was my impression it was just for this leg injury however the patient wished to also include frequent absences that may occur as often as 1 time a week for his asthma.  He has significant allergic factors and has since been seen twice by allergy and asthma.  He was seen end of October and then again just this month.  Extensive testing has shown he has a severe shellfish allergy and also has allergies to environmental grasses trees and cats.  The patient states he will occasionally miss work on average over a 92-month interval 1 time a week because of this.  He works in a Acupuncturist and  a Estate agent.  Patient also complains of left upper quadrant pain is been going on for about 6 weeks is progressively worse he has been to the emergency room twice for this.  No imaging is yet to occur.  He had a virtual visit with our PA last week and they ordered gastroenterology referral and Protonix however he never received the prescription for the Protonix.  When he went to the emergency room they did give him Carafate and he does have this medication.  He also notes the pain is gotten much worse in the left upper quadrant though he denies any nausea or vomiting.  He does not smoke or drink alcohol.  On arrival blood pressure is good 118/77.  He has been in the [place] for 7 years immigrating from [place].  He denies any abdominal pain while in [place].  He has never been tested for H. pylori.  He does have a pending appoint with gastroenterology but has not yet to be scheduled.   8/7 Patient seen return follow-up last visit was in January.  His left upper quadrant abdominal pain continues.  He is yet to achieve a CT scan.  He declined a GI referral in the call.  This visit was assisted by Arabic interpreter clonidine [id number]  Patient also is due a colon cancer screening.  He states that orange juice and chocolate make his symptoms worse  with his reflux.  He states the pantoprazole is beneficial.  Patient's asthma symptoms are stable at this time.  He is getting his Symb[facility]ort at [facility] but other medicines at the [facility]. Patient complains of pain above the left ear with twitching  12/20 Patient here for FMLA paperwork history of asthma he flares up about 3-4 times every month and requires to being out of work for at least a day with each flareup he is under some control with an allergist and follows with him.  He would like a flu vaccine will come in later for this.  He needs a work note for being out of work today to make this call.   06/12/23 This patient is seen by way of video visit.   He has had to be out of work for about a week due to an asthma flareup.  He states that he works between 34 and 45 degrees freezer work at the [employer] it flares his asthma.  He is also been seen by asthma and allergy he has multiple food and environmental allergies and has been offered allergy immunotherapy he is pondering this.  He maintains all his asthma medications.  He needs a letter return to work today as he is improving.  He would like to be limited to 55 degrees and above temperatures where his work is.  He has follow-up with asthma and allergy has all his breathing medications. He has an FMLA form from [employer] that needs to be filled out.  10/31/23  Past Medical History:  Diagnosis Date   Asthma     No past surgical history on file.  Family History  Problem Relation Age of Onset   Healthy Father    Allergic rhinitis Daughter     Social History   Socioeconomic History   Marital status: Married    Spouse name: Not on file   Number of children: Not on file   Years of education: Not on file   Highest education level: Not on file  Occupational History   Occupation: [employer]   Tobacco Use   Smoking status: Never   Smokeless tobacco: Never  Vaping Use   Vaping status: Never Used  Substance and Sexual Activity   Alcohol use: No    Alcohol/week: 0.0 standard drinks of alcohol   Drug use: No   Sexual activity: Yes  Other Topics Concern   Not on file  Social History Narrative   Works at [employer]    From [place]    Social Determinants of Corporate investment banker Strain: Not on file  Food Insecurity: Not on file  Transportation Needs: Not on file  Physical Activity: Not on file  Stress: Not on file  Social Connections: Not on file  Intimate Partner Violence: Not on file    Outpatient Medications Prior to Visit  Medication Sig Dispense Refill   albuterol (VENTOLIN HFA) 108 (90 Base) MCG/ACT inhaler Inhale 2 puffs into the lungs every 6  (six) hours as needed for wheezing or shortness of breath. Needs office visit 18 g 1   azelastine (ASTELIN) 0.1 % nasal spray Place 1-2 sprays into both nostrils 2 (two) times daily as needed (nasal drainage). Use in each nostril as directed 30 mL 5   budesonide-formoterol (SYMBICORT) 160-4.5 MCG/ACT inhaler Inhale 2 puffs into the lungs 2 (two) times daily. 10.2 g 1   cetirizine (ZYRTEC ALLERGY) 10 MG tablet Take 1 tablet (10 mg total) by mouth daily. 90  tablet 0   EPINEPHrine 0.3 mg/0.3 mL IJ SOAJ injection Inject 0.3 mg into the muscle as needed for anaphylaxis. 1 each 2   fluticasone (FLONASE) 50 MCG/ACT nasal spray Place 2 sprays into both nostrils daily. 16 g 5   olopatadine (PATADAY) 0.1 % ophthalmic solution Place 1 drop into both eyes 2 (two) times daily. 5 mL 4   pantoprazole (PROTONIX) 40 MG tablet Take 1 tablet (40 mg total) by mouth daily. 30 tablet 1   Spacer/Aero-Holding Chambers (AEROCHAMBER PLUS) inhaler Use as instructed 1 each 2   No facility-administered medications prior to visit.    Allergies  Allergen Reactions   Caffeine    Strawberry (Diagnostic) Itching    ROS Review of Systems  Constitutional:  Negative for chills, diaphoresis and fever.  HENT:  Negative for congestion, hearing loss, nosebleeds, postnasal drip, rhinorrhea, sore throat, tinnitus, trouble swallowing and voice change.   Eyes:  Negative for photophobia, redness and itching.  Respiratory:  Negative for cough, shortness of breath, wheezing and stridor.   Cardiovascular:  Negative for chest pain, palpitations and leg swelling.  Gastrointestinal:  Negative for abdominal distention, abdominal pain, anal bleeding, blood in stool, constipation, diarrhea, nausea, rectal pain and vomiting.       Reflux symptoms  Endocrine: Negative for polydipsia.  Genitourinary:  Negative for dysuria, flank pain, frequency, hematuria and urgency.  Musculoskeletal:  Negative for back pain, myalgias and neck pain.  Skin:   Negative for rash.  Allergic/Immunologic: Negative for environmental allergies.  Neurological:  Negative for dizziness, tremors, seizures, weakness and headaches.  Hematological:  Does not bruise/bleed easily.  Psychiatric/Behavioral: Negative.  Negative for suicidal ideas. The patient is not nervous/anxious.       Objective:    No exam this is a video visit Patient seen on video in no distress There were no vitals taken for this visit. Wt Readings from Last 3 Encounters:  09/17/23 179 lb (81.2 kg)  05/24/23 178 lb 11.2 oz (81.1 kg)  07/10/22 173 lb 9.6 oz (78.7 kg)     Health Maintenance Due  Topic Date Due   COLON CANCER SCREENING ANNUAL FOBT  Never done   INFLUENZA VACCINE  07/05/2023    There are no preventive care reminders to display for this patient.  Lab Results  Component Value Date   TSH 0.472 09/17/2023   Lab Results  Component Value Date   WBC 4.7 09/17/2023   HGB 13.2 09/17/2023   HCT 38.8 (L) 09/17/2023   MCV 85.3 09/17/2023   PLT 212 09/17/2023   Lab Results  Component Value Date   NA 139 09/17/2023   K 3.9 09/17/2023   CO2 25 09/17/2023   GLUCOSE 110 (H) 09/17/2023   BUN 9 09/17/2023   CREATININE 1.10 09/17/2023   BILITOT 0.8 09/17/2023   ALKPHOS 60 09/17/2023   AST 25 09/17/2023   ALT 23 09/17/2023   PROT 7.0 09/17/2023   ALBUMIN 3.8 09/17/2023   CALCIUM 9.1 09/17/2023   ANIONGAP 7 09/17/2023   No results found for: "CHOL" No results found for: "HDL" No results found for: "LDLCALC" No results found for: "TRIG" No results found for: "CHOLHDL" Lab Results  Component Value Date   HGBA1C 5.60 01/17/2016      Assessment & Plan:   Problem List Items Addressed This Visit   None  No orders of the defined types were placed in this encounter. 38 minutes spent extra time needed for filling out paperwork  Follow Up Instructions: Continue  with follow-up in person Letter is issued and FMLA form filled out I discussed the assessment and  treatment plan with the patient. The patient was provided an opportunity to ask questions and all were answered. The patient agreed with the plan and demonstrated an understanding of the instructions.   The patient was advised to call back or seek an in-person evaluation if the symptoms worsen or if the condition fails to improve as anticipated.  I provided  minutes of non-face-to-face time during this encounter  including  median intraservice time , review of notes, labs, imaging, medications  and explaining diagnosis and management to the patient .      Shan Levans, MD

## 2023-10-31 ENCOUNTER — Ambulatory Visit: Payer: 59 | Attending: Critical Care Medicine | Admitting: Critical Care Medicine

## 2023-10-31 ENCOUNTER — Encounter: Payer: Self-pay | Admitting: Critical Care Medicine

## 2023-10-31 VITALS — BP 103/68 | HR 73 | Temp 97.9°F | Ht 68.0 in | Wt 179.0 lb

## 2023-10-31 DIAGNOSIS — J4540 Moderate persistent asthma, uncomplicated: Secondary | ICD-10-CM

## 2023-10-31 DIAGNOSIS — K640 First degree hemorrhoids: Secondary | ICD-10-CM

## 2023-10-31 DIAGNOSIS — Z23 Encounter for immunization: Secondary | ICD-10-CM | POA: Diagnosis not present

## 2023-10-31 DIAGNOSIS — Z1211 Encounter for screening for malignant neoplasm of colon: Secondary | ICD-10-CM

## 2023-10-31 DIAGNOSIS — K649 Unspecified hemorrhoids: Secondary | ICD-10-CM | POA: Insufficient documentation

## 2023-10-31 MED ORDER — FLUTICASONE PROPIONATE 50 MCG/ACT NA SUSP: 2.0000 | Freq: Every day | NASAL | 5 refills | Status: AC

## 2023-10-31 MED ORDER — ALBUTEROL SULFATE HFA 108 (90 BASE) MCG/ACT IN AERS: 2.0000 | INHALATION_SPRAY | Freq: Four times a day (QID) | RESPIRATORY_TRACT | 1 refills | Status: AC | PRN

## 2023-10-31 MED ORDER — HYDROCORTISONE ACETATE 25 MG RE SUPP: 25.0000 mg | Freq: Two times a day (BID) | RECTAL | 1 refills | Status: AC | PRN

## 2023-10-31 MED ORDER — PANTOPRAZOLE SODIUM 40 MG PO TBEC: 40.0000 mg | DELAYED_RELEASE_TABLET | Freq: Every day | ORAL | 1 refills | Status: AC

## 2023-10-31 MED ORDER — CETIRIZINE HCL 10 MG PO TABS: 10.0000 mg | ORAL_TABLET | Freq: Every day | ORAL | 0 refills | Status: AC

## 2023-10-31 MED ORDER — OLOPATADINE HCL 0.1 % OP SOLN: 1.0000 [drp] | Freq: Two times a day (BID) | OPHTHALMIC | 4 refills | Status: AC

## 2023-10-31 MED ORDER — BUDESONIDE-FORMOTEROL FUMARATE 160-4.5 MCG/ACT IN AERO: 2.0000 | INHALATION_SPRAY | Freq: Two times a day (BID) | RESPIRATORY_TRACT | 3 refills | Status: AC

## 2023-10-31 MED ORDER — AEROCHAMBER MV MISC: 0 refills | Status: AC

## 2023-10-31 MED ORDER — AZELASTINE HCL 0.1 % NA SOLN: 1.0000 | Freq: Two times a day (BID) | NASAL | 5 refills | Status: AC | PRN

## 2023-10-31 NOTE — Assessment & Plan Note (Signed)
Referral to gastroenterology was made

## 2023-10-31 NOTE — Assessment & Plan Note (Signed)
Refill current medication refill out FMLA form and refer back to allergy and asthma

## 2023-10-31 NOTE — Patient Instructions (Addendum)
Medications refilled Flu vaccine given Return 6 months for primary care  Paper work will be filled out  Call allergy doctor for an appointment  Colonoscopy will be ordered Hemorrhoid cream ordered  ???? ????? ??????? ????? ???? ?????????? ?????? 6 ???? ??????? ???????  ???? ??? ????? ??????  ???? ????? ???????? ?????? ????  ???? ??? ????? ??????? ???? ???? ???????? 'iieadat taebiat al'adwia 'iieta' liqah alianfilwanza aleawdat 6 'ashhar lilrieayat al'awalia sayatimu mil' aleamal alwaraqii aitasal bitabib alhasaasiat litahdid maweid sayatimu talab tanzir alqawlun 'amart karim albawasir

## 2023-10-31 NOTE — Assessment & Plan Note (Signed)
Mild hemorrhoids will give Proctofoam rectal suppository as needed

## 2023-11-07 ENCOUNTER — Telehealth: Payer: Self-pay

## 2023-11-07 NOTE — Telephone Encounter (Signed)
Called patient using a [employer] interpreter, Ernest Griffin [phone], and LVM to call back informing patient that FMLA paperwork is ready for pick-up. Form left at the front desk.

## 2023-11-08 NOTE — Telephone Encounter (Signed)
Called patient using a [employer] interpreter, Athalia [phone], and LVM to call back informing patient that FMLA paperwork is ready for pick-up. Form left at the front desk.   Form was successfully faxed to [facility] on 10/31/2023.

## 2023-11-16 ENCOUNTER — Telehealth: Payer: Self-pay

## 2023-11-16 NOTE — Telephone Encounter (Signed)
Called patient and left voicemail, need visit for FMLA to be filled out.    Interpreter id # [phone]

## 2023-11-21 ENCOUNTER — Ambulatory Visit (HOSPITAL_COMMUNITY)
Admission: EM | Admit: 2023-11-21 | Discharge: 2023-11-21 | Disposition: A | Discharge status: Home / Self Care | Payer: Medicaid Other | Attending: Emergency Medicine | Admitting: Emergency Medicine

## 2023-11-21 ENCOUNTER — Encounter (HOSPITAL_COMMUNITY): Payer: Self-pay | Admitting: Emergency Medicine

## 2023-11-21 ENCOUNTER — Ambulatory Visit: Payer: Self-pay

## 2023-11-21 DIAGNOSIS — J4521 Mild intermittent asthma with (acute) exacerbation: Secondary | ICD-10-CM | POA: Diagnosis not present

## 2023-11-21 DIAGNOSIS — R519 Headache, unspecified: Secondary | ICD-10-CM | POA: Diagnosis not present

## 2023-11-21 MED ORDER — IBUPROFEN 800 MG PO TABS
ORAL_TABLET | ORAL | Status: AC
  Filled 2023-11-21: qty 1

## 2023-11-21 MED ORDER — PREDNISONE 20 MG PO TABS: 40.0000 mg | ORAL_TABLET | Freq: Every day | ORAL | 0 refills | Status: AC

## 2023-11-21 MED ORDER — IBUPROFEN 800 MG PO TABS
800.0000 mg | ORAL_TABLET | Freq: Once | ORAL | Status: AC
  Administered 2023-11-21: 800 mg via ORAL

## 2023-11-21 NOTE — ED Triage Notes (Signed)
Pt c/o abdominal pain, chest pain, and difficultly breathing since yesterday. States he left the window at his house open last night and has hx of asthma.

## 2023-11-21 NOTE — Telephone Encounter (Signed)
Chief Complaint: Appointment  Symptoms: headache, asthma Frequency: comes and goes  Pertinent Negatives: Patient denies current symptoms  Disposition: [] ED /[] Urgent Care (no appt availability in office) / [x] Appointment(In office/virtual)/ []  [facility] [place]/ [] Home Care/ [] Refused Recommended Disposition /[] [facility]/ []  Follow-up with PCP Additional Notes: Patient stated he was seen at  urgent care for his asthma and headache today and want to schedule an appointment with PCP due to not being able to see provider when he is sick. Patient denies the need for triage just wants an appointment to follow-up with a provider in office. Care advice was given. Patient has been scheduled for first available in office and was also given the mobile bus clinic location for Monday if needed. Patient verbalized understanding.   Reason for Disposition  Requesting regular office appointment  Answer Assessment - Initial Assessment Questions 1. REASON FOR CALL or QUESTION: "What is your reason for calling today?" or "How can I best help you?" or "What question do you have that I can help answer?"     I want to schedule an appointment with my PCP since I was seen at urgent care.  Protocols used: Information Only Call - No Triage-A-AH

## 2023-11-21 NOTE — Discharge Instructions (Addendum)
Take the steroids with breakfast for the next 5 days to help with your asthma.  You can continue to use your albuterol rescue inhaler every 6 hours as needed for any wheezing or shortness of breath.  Please do not sleep with the window opening anymore, as it is cold his night and can be harsh on your throat.  For any headache you can alternate between 800 mg of ibuprofen and 500 mg of Tylenol every 4-6 hours.  Your symptoms should improve with the steroids, if no improvement or any changes return to clinic.  ????? ??????????? ?? ???? ??????? ???? 5 ???? ????? ???????? ?? ???? ?????. ????? ????????? ?? ??????? ???? ??????? ????????? ?? 6 ????? ??? ?????? ??? ???? ?? ??? ?? ??????. ???? ??? ????? ?? ??? ??????? ??? ????? ??? ???? ???? ?????? ?? ??? ?????? ??? ???? ?????? ??? ????. ??? ???? ????? ??????? ??? 800 ??? ?? ???????????? ?500 ??? ?? ???????? ?? 4-6 ?????. ??? ?? ????? ?????? ?? ???????????? ??? ?? ???? ???? ?? ?? ???????? ?? ??? ???????.

## 2023-11-21 NOTE — ED Provider Notes (Signed)
[facility]    CSN: [id number] Arrival date & time: 11/21/23  0849      History   Chief Complaint Chief Complaint  Patient presents with   Abdominal Pain   Chest Pain    HPI Ernest Griffin is a 47 y.o. male.   Patient presents to clinic for complaints of wheezing and shortness of breath.  Reports his asthma started to flare up yesterday.  He did sleep with the window open last night, forgot he had opened it in the day.  He has not had any cough.  He has been using his daily Symbicort and albuterol inhaler as needed for any wheezing or shortness of breath.  Reports the shortness of breath will come back a few hours after using albuterol inhaler.  He has not had any fevers.  Endorses sneezing.  Has been taking his daily cetirizine.  No cough.  Will have chest pain when he feels short of breath.  He also complains of pain in his left upper abdominal area, this has been ongoing for some time.  No changes to this pain.   Patient declined medical interpreter.  The history is provided by the patient and medical records.  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 No pertinent surgical history.     Home Medications    Prior to Admission medications   Medication Sig Start Date End Date Taking? Authorizing Provider  predniSONE (DELTASONE) 20 MG tablet Take 2 tablets (40 mg total) by mouth daily for 5 days. 11/21/23  11/26/23 Yes Rinaldo Ratel, [place] N, FNP  albuterol (VENTOLIN HFA) 108 (90 Base) MCG/ACT inhaler Inhale 2 puffs into the lungs every 6 (six) hours as needed for wheezing or shortness of breath. Needs office visit 10/31/23   Storm Frisk, MD  azelastine (ASTELIN) 0.1 % nasal spray Place 1-2 sprays into both nostrils 2 (two) times daily as needed (nasal drainage). Use in each nostril as directed 10/31/23   Storm Frisk, MD  budesonide-formoterol [facility]) 160-4.5 MCG/ACT inhaler Inhale 2 puffs into the lungs 2 (two) times daily. 10/31/23   Storm Frisk, MD  cetirizine (ZYRTEC ALLERGY) 10 MG tablet Take 1 tablet (10 mg total) by mouth daily. 10/31/23   Storm Frisk, MD  EPINEPHrine 0.3 mg/0.3 mL IJ SOAJ injection Inject 0.3 mg into the muscle as needed for anaphylaxis. 05/24/23   Hetty Blend, FNP  fluticasone (FLONASE) 50 MCG/ACT nasal spray Place 2 sprays into both nostrils daily. 10/31/23   Storm Frisk, MD  hydrocortisone (ANUSOL-HC) 25 MG suppository Place 1 suppository (25 mg total) rectally 2 (two) times daily as needed for hemorrhoids or anal itching. 10/31/23   Storm Frisk, MD  olopatadine (PATADAY) 0.1 % ophthalmic solution Place 1 drop into both eyes 2 (two) times daily. 10/31/23   Storm Frisk, MD  pantoprazole (PROTONIX) 40 MG tablet Take 1 tablet (40 mg total) by mouth daily. 10/31/23  Storm Frisk, MD  Spacer/Aero-Holding Chambers (AEROCHAMBER MV) inhaler Use as instructed 10/31/23   Storm Frisk, MD  levocetirizine (XYZAL) 5 MG tablet Take 1 tablet (5 mg total) by mouth every evening. To decrease allergy symptoms 04/02/19 06/16/19  Cain Saupe, MD    Family History Family History  Problem Relation Age of Onset   Healthy Father    Allergic rhinitis Daughter     Social History Social History   Tobacco Use   Smoking status: Never   Smokeless tobacco: Never  Vaping Use   Vaping status: Never Used  Substance Use Topics   Alcohol use:  No    Alcohol/week: 0.0 standard drinks of alcohol   Drug use: No     Allergies   Caffeine and Strawberry (diagnostic)   Review of Systems Review of Systems  Per HPI  Physical Exam Triage Vital Signs ED Triage Vitals  Encounter Vitals Group     BP 11/21/23 0858 114/81     Systolic BP Percentile --      Diastolic BP Percentile --      Pulse Rate 11/21/23 0858 76     Resp 11/21/23 0858 17     Temp 11/21/23 0858 98.3 F (36.8 C)     Temp Source 11/21/23 0858 Oral     SpO2 11/21/23 0858 96 %     Weight --      Height --      Head Circumference --      Peak Flow --      Pain Score 11/21/23 0857 6     Pain Loc --      Pain Education --      Exclude from Growth Chart --    No data found.  Updated Vital Signs BP 114/81 (BP Location: Left Arm)   Pulse 76   Temp 98.3 F (36.8 C) (Oral)   Resp 17   SpO2 96%   Visual Acuity Right Eye Distance:   Left Eye Distance:   Bilateral Distance:    Right Eye Near:   Left Eye Near:    Bilateral Near:     Physical Exam Vitals and nursing note reviewed.  Constitutional:      Appearance: Normal appearance. He is well-developed.  HENT:     Head: Normocephalic and atraumatic.     Right Ear: External ear normal.     Left Ear: External ear normal.     Nose: Nose normal.     Mouth/Throat:     Mouth: Mucous membranes are moist.  Eyes:     General: No scleral icterus.    Conjunctiva/sclera: Conjunctivae normal.  Cardiovascular:     Rate and Rhythm: Normal rate and regular rhythm.     Heart sounds: Normal heart sounds. No murmur heard. Pulmonary:     Effort: Pulmonary effort is normal. No respiratory distress.     Breath sounds: Normal breath sounds.  Musculoskeletal:        General: Normal range of motion.  Neurological:     General: No focal deficit present.     Mental Status: He is alert and oriented to person, place, and time.  Psychiatric:        Mood and Affect: Mood normal.        Behavior: Behavior normal.       UC Treatments / Results  Labs (all labs ordered are listed, but only abnormal results are displayed) Labs Reviewed - No data to display  EKG   Radiology  No results found.  Procedures Procedures (including critical care time)  Medications Ordered in UC Medications  ibuprofen (ADVIL) tablet 800 mg (has no administration in time range)    Initial Impression / Assessment and Plan / UC Course  I have reviewed the triage vital signs and the nursing notes.  Pertinent labs & imaging results that were available during my care of the patient were reviewed by me and considered in my medical decision making (see chart for details).  Vitals and triage reviewed, patient is hemodynamically stable.  Lungs are vesicular, heart with regular rate and rhythm.  Able to speak in full sentences, no respiratory distress.  Increased use of albuterol and subjective shortness of breath, wheezing and chest pain with shortness of breath.  EKG shows normal sinus rhythm at a rate of 74 bpm, without ST elevation or ST depression.  Low concern for acute cardiac etiology.  Will cover with prednisone burst for mild asthma exacerbation.  Ibuprofen given for headache in clinic.  Symptomatic management for headache discussed.  Work note provided.  Plan of care, follow-up care return precautions given, no questions at this time.     Final Clinical Impressions(s) / UC Diagnoses   Final diagnoses:  Mild intermittent asthma with acute exacerbation  Intractable headache, unspecified chronicity pattern, unspecified headache type     Discharge Instructions      Take the steroids with breakfast for the next 5 days to help with your asthma.  You can continue to use your albuterol rescue inhaler every 6 hours as needed for any wheezing or shortness of breath.  Please do not sleep with the window opening anymore, as it is cold his night and can be harsh on your throat.  For any headache you can alternate between 800  mg of ibuprofen and 500 mg of Tylenol every 4-6 hours.  Your symptoms should improve with the steroids, if no improvement or any changes return to clinic.  ????? ??????????? ?? ???? ??????? ???? 5 ???? ????? ???????? ?? ???? ?????. ????? ????????? ?? ??????? ???? ??????? ????????? ?? 6 ????? ??? ?????? ??? ???? ?? ??? ?? ??????. ???? ??? ????? ?? ??? ??????? ??? ????? ??? ???? ???? ?????? ?? ??? ?????? ??? ???? ?????? ??? ????. ??? ???? ????? ??????? ??? 800 ??? ?? ???????????? ?500 ??? ?? ???????? ?? 4-6 ?????. ??? ?? ????? ?????? ?? ???????????? ??? ?? ???? ???? ?? ?? ???????? ?? ??? ???????.     ED Prescriptions     Medication Sig Dispense Auth. Provider   predniSONE (DELTASONE) 20 MG tablet Take 2 tablets (40 mg total) by mouth daily for 5 days. 10 tablet Reyah Streeter, [place] N, FNP      PDMP not reviewed this encounter.   Rinaldo Ratel [place] N, [place] 11/21/23 [phone]

## 2023-11-22 NOTE — Telephone Encounter (Signed)
noted 

## 2023-12-15 ENCOUNTER — Encounter (HOSPITAL_COMMUNITY): Payer: Self-pay

## 2023-12-15 ENCOUNTER — Emergency Department (HOSPITAL_COMMUNITY): Payer: 59

## 2023-12-15 ENCOUNTER — Other Ambulatory Visit: Payer: Self-pay

## 2023-12-15 ENCOUNTER — Emergency Department (HOSPITAL_COMMUNITY)
Admission: EM | Admit: 2023-12-15 | Discharge: 2024-01-05 | Disposition: E | Payer: 59 | Attending: Student | Admitting: Student

## 2023-12-15 DIAGNOSIS — S0990XA Unspecified injury of head, initial encounter: Secondary | ICD-10-CM | POA: Diagnosis present

## 2023-12-15 DIAGNOSIS — I469 Cardiac arrest, cause unspecified: Secondary | ICD-10-CM | POA: Insufficient documentation

## 2023-12-15 DIAGNOSIS — S0180XA Unspecified open wound of other part of head, initial encounter: Secondary | ICD-10-CM | POA: Diagnosis not present

## 2023-12-15 LAB — I-STAT CG4 LACTIC ACID, ED: Lactic Acid, Venous: 6.9 mmol/L (ref 0.5–1.9)

## 2023-12-15 LAB — I-STAT CHEM 8, ED
BUN: 13 mg/dL (ref 6–20)
Calcium, Ion: 0.87 mmol/L — CL (ref 1.15–1.40)
Chloride: 113 mmol/L — ABNORMAL HIGH (ref 98–111)
Creatinine, Ser: 1.2 mg/dL (ref 0.61–1.24)
Glucose, Bld: 81 mg/dL (ref 70–99)
HCT: 37 % — ABNORMAL LOW (ref 39.0–52.0)
Hemoglobin: 12.6 g/dL — ABNORMAL LOW (ref 13.0–17.0)
Potassium: 4 mmol/L (ref 3.5–5.1)
Sodium: 138 mmol/L (ref 135–145)
TCO2: 17 mmol/L — ABNORMAL LOW (ref 22–32)

## 2023-12-15 LAB — PREPARE RBC (CROSSMATCH)

## 2023-12-15 MED ORDER — SODIUM CHLORIDE 0.9 % IV SOLN
INTRAVENOUS | Status: AC | PRN
  Administered 2023-12-15: 1000 mL via INTRAVENOUS

## 2023-12-15 MED ORDER — SODIUM BICARBONATE 8.4 % IV SOLN
INTRAVENOUS | Status: AC | PRN
  Administered 2023-12-15: 50 meq via INTRAVENOUS

## 2023-12-15 MED ORDER — EPINEPHRINE 1 MG/10ML IJ SOSY
PREFILLED_SYRINGE | INTRAMUSCULAR | Status: AC | PRN
  Administered 2023-12-15 (×3): 1 mg via INTRAVENOUS

## 2023-12-15 MED ORDER — EPINEPHRINE 1 MG/10ML IJ SOSY
PREFILLED_SYRINGE | INTRAMUSCULAR | Status: AC | PRN
  Administered 2023-12-15: 1 mg via INTRAVENOUS

## 2023-12-15 MED ORDER — SODIUM CHLORIDE 0.9% IV SOLUTION: Freq: Once | INTRAVENOUS | Status: DC

## 2023-12-15 NOTE — ED Notes (Signed)
 No pulses palpated and no cardiac activity with [place] per Dr. Posey Rea.

## 2023-12-15 NOTE — Progress Notes (Signed)
 Orthopedic Tech Progress Note Patient Details:  Ernest Griffin 12/04/1877 [id number]  Patient ID: Ernest Griffin, male   DOB: 12/04/1877, 48 y.o.   MRN: [id number] Level I; not currently needed. Darleen Crocker 12/15/2023, 7:02 PM
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
## 2023-12-15 NOTE — Code Documentation (Signed)
Patient time of death occurred at 1858. 
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
## 2023-12-15 NOTE — ED Notes (Signed)
 Unable to obtain a manual BP - tech states she can't hear anything

## 2023-12-15 NOTE — ED Notes (Signed)
 Per [employer], they will not being pursuing any tissue or eye or organ donation at this time.

## 2023-12-15 NOTE — ED Notes (Signed)
 This RN has called [employer] and spoke with Frann Rider - potentially suitable for tissues and left eye donation. Referral number [id number].
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
## 2023-12-17 ENCOUNTER — Encounter (HOSPITAL_COMMUNITY): Payer: Self-pay | Admitting: Emergency Medicine

## 2023-12-17 LAB — TYPE AND SCREEN: Unit division: 0

## 2023-12-17 LAB — BPAM RBC
Blood Product Expiration Date: 202502152359
ISSUE DATE / TIME: 202501111644
Unit Type and Rh: 5100

## 2023-12-20 ENCOUNTER — Ambulatory Visit: Payer: 59 | Admitting: Critical Care Medicine

## 2024-01-05 NOTE — Progress Notes (Deleted)
Established Patient Office Visit  Subjective:  Patient ID: Ernest Griffin, male    DOB: 27-May-1976  Age: 48 y.o. MRN: [id number]     CC:  Paperwork and asthma follow-up  HPI  12/2021  this visit was accomplished by Arabic interpreter Claudine #[id number] on video Ernest Griffin presents for primary care follow-up.  Patient was last seen in November when he had a left lower extremity injury.  He subsequently got in with his orthopedist who did an MRI of the left lower extremity which showed a stress fracture of the tibia.  This healed on its own secondarily and the pain in the leg is now gone.  We filled out his FMLA paperwork in November it was my impression it was just for this leg injury however the patient wished to also include frequent absences that may occur as often as 1 time a week for his asthma.  He has significant allergic factors and has since been seen twice by allergy and asthma.  He was seen end of October and then again just this month.  Extensive testing has shown he has a severe shellfish allergy and also has allergies to environmental grasses trees and cats.  The patient states he will occasionally miss work on average over a 88-month interval 1 time a week because of this.  He works in a Acupuncturist and a Estate agent.  Patient also complains of left upper quadrant pain is been going on for about 6 weeks is progressively worse he has been to the emergency room twice for this.  No imaging is yet to occur.  He had a virtual visit with our PA last week and they ordered gastroenterology referral and Protonix however he never received the prescription for the Protonix.  When he went to the emergency room they did give him Carafate and he does have this medication.  He also notes the pain is gotten much worse in the left upper quadrant though he denies any nausea or vomiting.  He does not smoke or drink alcohol.  On arrival blood pressure is good 118/77.  He has been in the [place]  for 7 years immigrating from [place].  He denies any abdominal pain while in [place].  He has never been tested for H. pylori.  He does have a pending appoint with gastroenterology but has not yet to be scheduled.   8/7 Patient seen return follow-up last visit was in January.  His left upper quadrant abdominal pain continues.  He is yet to achieve a CT scan.  He declined a GI referral in the call.  This visit was assisted by Arabic interpreter clonidine [id number]  Patient also is due a colon cancer screening.  He states that orange juice and chocolate make his symptoms worse with his reflux.  He states the pantoprazole is beneficial.  Patient's asthma symptoms are stable at this time.  He is getting his Symbicort at [facility] but other medicines at the [facility]. Patient complains of pain above the left ear with twitching  12/20 Patient here for FMLA paperwork history of asthma he flares up about 3-4 times every month and requires to being out of work for at least a Griffin with each flareup he is under some control with an allergist and follows with him.  He would like a flu vaccine will come in later for this.  He needs a work note for being out of work today to make this call.   06/12/23 This  patient is seen by way of video visit.  He has had to be out of work for about a week due to an asthma flareup.  He states that he works between 34 and 45 degrees freezer work at the [employer] it flares his asthma.  He is also been seen by asthma and allergy he has multiple food and environmental allergies and has been offered allergy immunotherapy he is pondering this.  He maintains all his asthma medications.  He needs a letter return to work today as he is improving.  He would like to be limited to 55 degrees and above temperatures where his work is.  He has follow-up with asthma and allergy has all his breathing medications. He has an FMLA form from [employer] that needs to be filled out.  10/31/23 The patient is  seen in return follow-up and needs paper work filled out for his FMLA.  He is still having twice a week asthma flares that requires him to miss occasional 1-2 times a week of work.  He has history of established with asthma and allergy and will need follow-up.  There are no other complaints.  He agreed and received the flu vaccine and needs a colonoscopy.  Patient does complain of itching and burning in the rectum concerned about hemorrhoids.   12/19/22  Past Medical History:  Diagnosis Date   Asthma     No past surgical history on file.  Family History  Problem Relation Age of Onset   Healthy Father    Allergic rhinitis Daughter     Social History   Socioeconomic History   Marital status: Married    Spouse name: Not on file   Number of children: Not on file   Years of education: Not on file   Highest education level: Not on file  Occupational History   Occupation: [employer]   Tobacco Use   Smoking status: Never   Smokeless tobacco: Never  Vaping Use   Vaping status: Never Used  Substance and Sexual Activity   Alcohol use: No    Alcohol/week: 0.0 standard drinks of alcohol   Drug use: No   Sexual activity: Yes  Other Topics Concern   Not on file  Social History Narrative   Works at [employer]    From [place]    Social Drivers of [employer] Strain: Low Risk  (10/31/2023)   Overall Financial Resource Strain (CARDIA)    Difficulty of Paying Living Expenses: Not very hard  Food Insecurity: No Food Insecurity (10/31/2023)   Hunger Vital Sign    Worried About Running Out of Food in the Last Year: Never true    Ran Out of Food in the Last Year: Never true  Transportation Needs: No Transportation Needs (10/31/2023)   PRAPARE - Administrator, Civil Service (Medical): No    Lack of Transportation (Non-Medical): No  Physical Activity: Inactive (10/31/2023)   Exercise Vital Sign    Days of Exercise per Week: 0 days    Minutes of  Exercise per Session: 0 min  Stress: No Stress Concern Present (10/31/2023)   [employer] of Occupational Health - Occupational Stress Questionnaire    Feeling of Stress : Not at all  Social Connections: Moderately Integrated (10/31/2023)   Social Connection and Isolation Panel [NHANES]    Frequency of Communication with Friends and Family: Three times a week    Frequency of Social Gatherings with Friends and Family: Once a  week    Attends Religious Services: Never    Active Member of Clubs or Organizations: Yes    Attends Banker Meetings: More than 4 times per year    Marital Status: Married  Catering manager Violence: Not At Risk (10/31/2023)   Humiliation, Afraid, Rape, and Kick questionnaire    Fear of Current or Ex-Partner: No    Emotionally Abused: No    Physically Abused: No    Sexually Abused: No    Outpatient Medications Prior to Visit  Medication Sig Dispense Refill   albuterol (VENTOLIN HFA) 108 (90 Base) MCG/ACT inhaler Inhale 2 puffs into the lungs every 6 (six) hours as needed for wheezing or shortness of breath. Needs office visit 18 g 1   azelastine (ASTELIN) 0.1 % nasal spray Place 1-2 sprays into both nostrils 2 (two) times daily as needed (nasal drainage). Use in each nostril as directed 30 mL 5   budesonide-formoterol (SYMBICORT) 160-4.5 MCG/ACT inhaler Inhale 2 puffs into the lungs 2 (two) times daily. 10.2 g 3   cetirizine (ZYRTEC ALLERGY) 10 MG tablet Take 1 tablet (10 mg total) by mouth daily. 90 tablet 0   EPINEPHrine 0.3 mg/0.3 mL IJ SOAJ injection Inject 0.3 mg into the muscle as needed for anaphylaxis. 1 each 2   fluticasone (FLONASE) 50 MCG/ACT nasal spray Place 2 sprays into both nostrils daily. 16 g 5   hydrocortisone (ANUSOL-HC) 25 MG suppository Place 1 suppository (25 mg total) rectally 2 (two) times daily as needed for hemorrhoids or anal itching. 24 suppository 1   olopatadine (PATADAY) 0.1 % ophthalmic solution Place 1 drop into  both eyes 2 (two) times daily. 5 mL 4   pantoprazole (PROTONIX) 40 MG tablet Take 1 tablet (40 mg total) by mouth daily. 30 tablet 1   Spacer/Aero-Holding Chambers (AEROCHAMBER MV) inhaler Use as instructed 1 each 0   No facility-administered medications prior to visit.    Allergies  Allergen Reactions   Caffeine    Strawberry (Diagnostic) Itching    ROS Review of Systems  Constitutional:  Negative for chills, diaphoresis and fever.  HENT:  Positive for postnasal drip, rhinorrhea and sneezing. Negative for congestion, hearing loss, nosebleeds, sore throat, tinnitus, trouble swallowing and voice change.   Eyes:  Negative for photophobia, redness and itching.  Respiratory:  Positive for cough and wheezing. Negative for shortness of breath and stridor.   Cardiovascular:  Negative for chest pain, palpitations and leg swelling.  Gastrointestinal:  Negative for abdominal distention, abdominal pain, anal bleeding, blood in stool, constipation, diarrhea, nausea, rectal pain and vomiting.       Reflux symptoms  Endocrine: Negative for polydipsia.  Genitourinary:  Negative for dysuria, flank pain, frequency, hematuria and urgency.  Musculoskeletal:  Negative for back pain, myalgias and neck pain.  Skin:  Negative for rash.  Allergic/Immunologic: Negative for environmental allergies.  Neurological:  Negative for dizziness, tremors, seizures, weakness and headaches.  Hematological:  Does not bruise/bleed easily.  Psychiatric/Behavioral: Negative.  Negative for suicidal ideas. The patient is not nervous/anxious.       Objective:    Marland Kitchen There were no vitals filed for this visit.   Gen: Pleasant, well-nourished, in no distress,  normal affect  ENT: No lesions,  mouth clear,  oropharynx clear, no postnasal drip  Neck: No JVD, no TMG, no carotid bruits  Lungs: No use of accessory muscles, no dullness to percussion, clear without rales or rhonchi  Cardiovascular: RRR, heart sounds normal,  no murmur or  gallops, no peripheral edema  Abdomen: soft and NT, no HSM,  BS normal, rectal exam shows mild hemorrhoids without bleeding  Musculoskeletal: No deformities, no cyanosis or clubbing  Neuro: alert, non focal  Skin: Warm, no lesions or rashes  No results found.  There were no vitals taken for this visit. Wt Readings from Last 3 Encounters:  10/31/23 179 lb (81.2 kg)  09/17/23 179 lb (81.2 kg)  05/24/23 178 lb 11.2 oz (81.1 kg)     Health Maintenance Due  Topic Date Due   Pneumococcal Vaccine 19-51 Years old (1 of 2 - PCV) Never done   COLON CANCER SCREENING ANNUAL FOBT  Never done   Zoster Vaccines- Shingrix (1 of 2) Never done    There are no preventive care reminders to display for this patient.  Lab Results  Component Value Date   TSH 0.472 09/17/2023   Lab Results  Component Value Date   WBC 4.7 09/17/2023   HGB 13.2 09/17/2023   HCT 38.8 (L) 09/17/2023   MCV 85.3 09/17/2023   PLT 212 09/17/2023   Lab Results  Component Value Date   NA 139 09/17/2023   K 3.9 09/17/2023   CO2 25 09/17/2023   GLUCOSE 110 (H) 09/17/2023   BUN 9 09/17/2023   CREATININE 1.10 09/17/2023   BILITOT 0.8 09/17/2023   ALKPHOS 60 09/17/2023   AST 25 09/17/2023   ALT 23 09/17/2023   PROT 7.0 09/17/2023   ALBUMIN 3.8 09/17/2023   CALCIUM 9.1 09/17/2023   ANIONGAP 7 09/17/2023   No results found for: "CHOL" No results found for: "HDL" No results found for: "LDLCALC" No results found for: "TRIG" No results found for: "CHOLHDL" Lab Results  Component Value Date   HGBA1C 5.60 01/17/2016      Assessment & Plan:   Problem List Items Addressed This Visit   None   No orders of the defined types were placed in this encounter. 38 minutes spent extra time needed for filling out paperwork  No follow-ups on file.    Shan Levans, MD

## 2024-01-05 DEATH — deceased

## 2024-04-21 ENCOUNTER — Ambulatory Visit: Payer: 59 | Admitting: Internal Medicine
